# Patient Record
Sex: Female | Born: 1975 | Race: White | Hispanic: No | Marital: Married | Smoking: Never smoker
Health system: Southern US, Community
[De-identification: ages and names within clinical notes are randomized; demographics above are authoritative.]

## PROBLEM LIST (undated history)

## (undated) DIAGNOSIS — S0300XA Dislocation of jaw, unspecified side, initial encounter: Secondary | ICD-10-CM

## (undated) DIAGNOSIS — G43909 Migraine, unspecified, not intractable, without status migrainosus: Secondary | ICD-10-CM

## (undated) DIAGNOSIS — R42 Dizziness and giddiness: Secondary | ICD-10-CM

## (undated) DIAGNOSIS — J189 Pneumonia, unspecified organism: Secondary | ICD-10-CM

## (undated) HISTORY — DX: Dislocation of jaw, unspecified side, initial encounter: S03.00XA

## (undated) HISTORY — PX: BILATERAL SALPINGECTOMY: SHX5743

## (undated) HISTORY — PX: TUBAL LIGATION: SHX77

## (undated) HISTORY — DX: Dizziness and giddiness: R42

## (undated) HISTORY — PX: ANKLE ARTHROSCOPY: SUR85

## (undated) HISTORY — DX: Pneumonia, unspecified organism: J18.9

## (undated) HISTORY — PX: PARTIAL HYSTERECTOMY: SHX80

## (undated) HISTORY — PX: APPENDECTOMY: SHX54

---

## 2016-09-27 ENCOUNTER — Encounter (HOSPITAL_BASED_OUTPATIENT_CLINIC_OR_DEPARTMENT_OTHER): Payer: Self-pay | Admitting: *Deleted

## 2016-09-27 ENCOUNTER — Emergency Department (HOSPITAL_BASED_OUTPATIENT_CLINIC_OR_DEPARTMENT_OTHER)
Admission: EM | Admit: 2016-09-27 | Discharge: 2016-09-27 | Disposition: A | Discharge status: Home / Self Care | Payer: Managed Care, Other (non HMO) | Attending: Emergency Medicine | Admitting: Emergency Medicine

## 2016-09-27 DIAGNOSIS — G43911 Migraine, unspecified, intractable, with status migrainosus: Secondary | ICD-10-CM | POA: Diagnosis not present

## 2016-09-27 DIAGNOSIS — G43909 Migraine, unspecified, not intractable, without status migrainosus: Secondary | ICD-10-CM | POA: Diagnosis present

## 2016-09-27 HISTORY — DX: Migraine, unspecified, not intractable, without status migrainosus: G43.909

## 2016-09-27 MED ORDER — DEXAMETHASONE SODIUM PHOSPHATE 10 MG/ML IJ SOLN
10.0000 mg | Freq: Once | INTRAMUSCULAR | Status: AC
  Administered 2016-09-27: 10 mg via INTRAVENOUS
  Filled 2016-09-27: qty 1

## 2016-09-27 MED ORDER — SODIUM CHLORIDE 0.9 % IV SOLN
INTRAVENOUS | Status: DC
  Administered 2016-09-27: 10:00:00 via INTRAVENOUS

## 2016-09-27 MED ORDER — DIPHENHYDRAMINE HCL 50 MG/ML IJ SOLN
25.0000 mg | Freq: Once | INTRAMUSCULAR | Status: AC
  Administered 2016-09-27: 25 mg via INTRAVENOUS
  Filled 2016-09-27: qty 1

## 2016-09-27 MED ORDER — PROMETHAZINE HCL 25 MG/ML IJ SOLN
12.5000 mg | Freq: Once | INTRAMUSCULAR | Status: AC
  Administered 2016-09-27: 12.5 mg via INTRAVENOUS
  Filled 2016-09-27: qty 1

## 2016-09-27 MED ORDER — SODIUM CHLORIDE 0.9 % IV BOLUS (SEPSIS)
1000.0000 mL | Freq: Once | INTRAVENOUS | Status: AC
  Administered 2016-09-27: 1000 mL via INTRAVENOUS

## 2016-09-27 NOTE — Discharge Instructions (Signed)
Recommend going home and rest in a dark room. Return for any new or worse symptoms. Or if not better by tomorrow. Work note provided.

## 2016-09-27 NOTE — ED Triage Notes (Signed)
Pt reports her usual migraine pain x Monday. Pt is alert, smiling and talking in nad.

## 2016-09-27 NOTE — ED Provider Notes (Signed)
[facility]   CSN: [id number] Arrival date & time: 09/27/16  0848     History   Chief Complaint Chief Complaint  Patient presents with  . Migraine    HPI Patience Rebecca Briggs is a 41 y.o. female.  Patient with history of migraines. Patient states this is very typical for her migraines. Symptoms started on Monday. Headache is bilateral 4 head. Also some pain at the base of the skull. Associated with nausea but no vomiting some dizziness but no vertigo and photophobia. Patient tried Imitrex without any relief.      Past Medical History:  Diagnosis Date  . Migraine     There are no active problems to display for this patient.   History reviewed. No pertinent surgical history.  OB History    No data available       Home Medications    Prior to Admission medications   Not on File    Family History History reviewed. No pertinent family history.  Social History Social History  Substance Use Topics  . Smoking status: Never Smoker  . Smokeless tobacco: Never Used  . Alcohol use Not on file     Allergies   Patient has no known allergies.   Review of Systems Review of Systems  Constitutional: Negative for fever.  HENT: Negative for congestion.   Eyes: Positive for photophobia. Negative for visual disturbance.  Respiratory: Negative for shortness of breath.   Cardiovascular: Negative for chest pain.  Gastrointestinal: Negative for abdominal pain.  Genitourinary: Negative for dysuria.  Musculoskeletal: Positive for neck pain. Negative for back pain.  Neurological: Positive for dizziness and headaches.  Hematological: Does not bruise/bleed easily.  Psychiatric/Behavioral: Negative for confusion.     Physical Exam Updated Vital Signs BP 124/90 (BP Location: Left Arm)   Pulse 95   Temp 98.4 F (36.9 C) (Oral)   Resp 18   Ht 5' 7.5" (1.715 m)   Wt 84.8 kg   LMP 09/11/2016   SpO2 100%   BMI 28.86 kg/m   Physical Exam    Constitutional: She is oriented to person, place, and time. She appears well-developed and well-nourished. No distress.  HENT:  Head: Normocephalic and atraumatic.  Mouth/Throat: Oropharynx is clear and moist.  Eyes: Conjunctivae and EOM are normal. Pupils are equal, round, and reactive to light.  Neck: Normal range of motion. Neck supple.  Cardiovascular: Normal rate and regular rhythm.   Pulmonary/Chest: Effort normal and breath sounds normal.  Abdominal: Soft. Bowel sounds are normal. There is no tenderness.  Musculoskeletal: Normal range of motion.  Neurological: She is alert and oriented to person, place, and time. No cranial nerve deficit or sensory deficit. She exhibits normal muscle tone. Coordination normal.  Skin: Skin is warm.  Nursing note and vitals reviewed.    ED Treatments / Results  Labs (all labs ordered are listed, but only abnormal results are displayed) Labs Reviewed - No data to display  EKG  EKG Interpretation None       Radiology No results found.  Procedures Procedures (including critical care time)  Medications Ordered in ED Medications  0.9 %  sodium chloride infusion ( Intravenous New Bag/Given 09/27/16 0959)  sodium chloride 0.9 % bolus 1,000 mL (1,000 mLs Intravenous New Bag/Given 09/27/16 0955)  promethazine (PHENERGAN) injection 12.5 mg (12.5 mg Intravenous Given 09/27/16 0955)  dexamethasone (DECADRON) injection 10 mg (10 mg Intravenous Given 09/27/16 0956)  diphenhydrAMINE (BENADRYL) injection 25 mg (25 mg Intravenous Given 09/27/16 0956)  Initial Impression / Assessment and Plan / ED Course  I have reviewed the triage vital signs and the nursing notes.  Pertinent labs & imaging results that were available during my care of the patient were reviewed by me and considered in my medical decision making (see chart for details).     Patient nontoxic no acute distress. Symptoms consistent with migraine for her. That started on Monday. Did not  respond to her Imitrex. Patient given migraine cocktail here Phenergan, and a drill, Decadron. Patient stable for discharge home. Patient will rest. Patient with only minimal improvement with medications but not worse. Patient able to rest comfortably.  Final Clinical Impressions(s) / ED Diagnoses   Final diagnoses:  Intractable migraine with status migrainosus, unspecified migraine type    New Prescriptions New Prescriptions   No medications on file     Vanetta MuldersScott Jermel Artley, MD 09/27/16 1116

## 2016-10-05 ENCOUNTER — Encounter (HOSPITAL_BASED_OUTPATIENT_CLINIC_OR_DEPARTMENT_OTHER): Payer: Self-pay

## 2016-10-05 ENCOUNTER — Emergency Department (HOSPITAL_BASED_OUTPATIENT_CLINIC_OR_DEPARTMENT_OTHER)
Admission: EM | Admit: 2016-10-05 | Discharge: 2016-10-05 | Disposition: A | Discharge status: Home / Self Care | Payer: Managed Care, Other (non HMO) | Attending: Emergency Medicine | Admitting: Emergency Medicine

## 2016-10-05 ENCOUNTER — Emergency Department (HOSPITAL_BASED_OUTPATIENT_CLINIC_OR_DEPARTMENT_OTHER): Payer: Managed Care, Other (non HMO)

## 2016-10-05 DIAGNOSIS — G43009 Migraine without aura, not intractable, without status migrainosus: Secondary | ICD-10-CM | POA: Insufficient documentation

## 2016-10-05 DIAGNOSIS — R42 Dizziness and giddiness: Secondary | ICD-10-CM | POA: Diagnosis present

## 2016-10-05 LAB — CBC
HEMATOCRIT: 40.1 % (ref 36.0–46.0)
Hemoglobin: 13.9 g/dL (ref 12.0–15.0)
MCH: 30.8 pg (ref 26.0–34.0)
MCHC: 34.7 g/dL (ref 30.0–36.0)
MCV: 88.9 fL (ref 78.0–100.0)
PLATELETS: 142 10*3/uL — AB (ref 150–400)
RBC: 4.51 MIL/uL (ref 3.87–5.11)
RDW: 11.8 % (ref 11.5–15.5)
WBC: 5.3 10*3/uL (ref 4.0–10.5)

## 2016-10-05 LAB — BASIC METABOLIC PANEL
Anion gap: 6 (ref 5–15)
BUN: 14 mg/dL (ref 6–20)
CHLORIDE: 106 mmol/L (ref 101–111)
CO2: 25 mmol/L (ref 22–32)
CREATININE: 0.66 mg/dL (ref 0.44–1.00)
Calcium: 8.9 mg/dL (ref 8.9–10.3)
GFR calc non Af Amer: >60 mL/min (ref >60)
Glucose, Bld: 98 mg/dL (ref 65–99)
POTASSIUM: 4.1 mmol/L (ref 3.5–5.1)
Sodium: 137 mmol/L (ref 135–145)

## 2016-10-05 MED ORDER — SODIUM CHLORIDE 0.9 % IV BOLUS (SEPSIS)
2000.0000 mL | Freq: Once | INTRAVENOUS | Status: AC
  Administered 2016-10-05: 2000 mL via INTRAVENOUS

## 2016-10-05 MED ORDER — PROCHLORPERAZINE EDISYLATE 5 MG/ML IJ SOLN
10.0000 mg | Freq: Once | INTRAMUSCULAR | Status: AC
  Administered 2016-10-05: 10 mg via INTRAVENOUS
  Filled 2016-10-05: qty 2

## 2016-10-05 MED ORDER — SODIUM CHLORIDE 0.9 % IV BOLUS (SEPSIS): 1000.0000 mL | Freq: Once | INTRAVENOUS | Status: DC

## 2016-10-05 NOTE — ED Triage Notes (Addendum)
C/o dizziness x 2 days along with HA-pt was treated last week for migraine-n/v, blurred vision started last night-NAD/pale-slow gait due to c/o dizziness-pt drove self to ED

## 2016-10-05 NOTE — ED Provider Notes (Signed)
[facility]   CSN: [id number] Arrival date & time: 10/05/16  1143     History   Chief Complaint Chief Complaint  Patient presents with  . Dizziness    HPI Rebecca Briggs is a 41 y.o. female.  HPI  Past Medical History:  Diagnosis Date  . Migraine     There are no active problems to display for this patient. comPlains of throbbing headache or onset approximately one week ago. She was seen here 09/27/2016 for same complaint. Receive "migraine headache cocktail" and felt temporarily improved. She treated self with Imitrex when the headaches first started, without relief. Since discharged from here on 09/27/2016 take his worsened coated by lightheadedness and she states she's fainted 3 or 4 times due to lightheadedness she's vomited 4 times yesterday. She denies any fever. She reports her vision is sometimes "blurred" in that upon looking from side to side she does not feel like "my eyes with me" no other associated symptoms. Headache is throbbing in nature. She gets severe migraine headaches approximately 4 times per year. Associated symptoms include dizziness meaning lightheadedness no sensation of vertigo. Also complains of photophobia. No other associated symptoms bright lights make symptoms worse. Nothing makes symptoms better. She has no hyperacusis.  Past Surgical History:  Procedure Laterality Date  . APPENDECTOMY    . TUBAL LIGATION      OB History    No data available       Home Medications    Prior to Admission medications   Not on File    Family History No family history on file.  Social History Social History  Substance Use Topics  . Smoking status: Never Smoker  . Smokeless tobacco: Never Used  . Alcohol use Yes     Comment: occ    Denies drug use Allergies   Patient has no known allergies.   Review of Systems Review of Systems  Constitutional: Negative.   HENT: Negative.   Eyes: Positive for photophobia and visual  disturbance.  Respiratory: Negative.   Cardiovascular: Negative.   Gastrointestinal: Positive for nausea and vomiting.  Musculoskeletal: Negative.   Skin: Negative.   Neurological: Positive for light-headedness.  Psychiatric/Behavioral: Negative.   All other systems reviewed and are negative.    Physical Exam Updated Vital Signs BP 155/76 (BP Location: Left Arm)   Pulse 78   Temp 98.2 F (36.8 C) (Oral)   Resp 16   Ht 5\' 7"  (1.702 m)   Wt 191 lb (86.6 kg)   LMP 09/11/2016   SpO2 100%   BMI 29.91 kg/m   Physical Exam  Constitutional: She is oriented to person, place, and time. She appears well-developed and well-nourished. No distress.  HENT:  Head: Normocephalic and atraumatic.  Right Ear: External ear normal.  Left Ear: External ear normal.  Mouth/Throat: Oropharynx is clear and moist.  BiLateral tympanic membranes normal  Eyes: Conjunctivae are normal. Pupils are equal, round, and reactive to light.  Fundi benign  Neck: Neck supple. No tracheal deviation present. No thyromegaly present.  Cardiovascular: Normal rate and regular rhythm.   No murmur heard. Pulmonary/Chest: Effort normal and breath sounds normal.  Abdominal: Soft. Bowel sounds are normal. She exhibits no distension. There is no tenderness.  Musculoskeletal: Normal range of motion. She exhibits no edema or tenderness.  Neurological: She is alert and oriented to person, place, and time. No cranial nerve deficit. Coordination normal.  Cranial nerves II through XII grossly intact. Gait normal Romberg normal pronator  drift normal finger to nose normal DTR symmetric bilaterally at knee jerk ankle jerk and biceps toes downward going bilaterally  Skin: Skin is warm and dry. No rash noted.  Psychiatric: She has a normal mood and affect.  Nursing note and vitals reviewed.    ED Treatments / Results  Labs (all labs ordered are listed, but only abnormal results are displayed) Labs Reviewed  BASIC METABOLIC  PANEL  CBC    EKG  EKG Interpretation  Date/Time:  Friday October 05 2016 12:04:55 EST Ventricular Rate:  87 PR Interval:  128 QRS Duration: 68 QT Interval:  360 QTC Calculation: 433 R Axis:   40 Text Interpretation:  Normal sinus rhythm with sinus arrhythmia Low voltage QRS Septal infarct , age undetermined Abnormal ECG No old tracing to compare Confirmed by Ethelda Chick  MD, Sereen Schaff [phone]) on 10/05/2016 12:22:23 PM      Results for orders placed or performed during the hospital encounter of 10/05/16  Basic metabolic panel  Result Value Ref Range   Sodium 137 135 - 145 mmol/L   Potassium 4.1 3.5 - 5.1 mmol/L   Chloride 106 101 - 111 mmol/L   CO2 25 22 - 32 mmol/L   Glucose, Bld 98 65 - 99 mg/dL   BUN 14 6 - 20 mg/dL   Creatinine, Ser 6.04 0.44 - 1.00 mg/dL   Calcium 8.9 8.9 - 54.0 mg/dL   GFR calc non Af Amer >60 >60 mL/min   GFR calc Af Amer >60 >60 mL/min   Anion gap 6 5 - 15  CBC  Result Value Ref Range   WBC 5.3 4.0 - 10.5 K/uL   RBC 4.51 3.87 - 5.11 MIL/uL   Hemoglobin 13.9 12.0 - 15.0 g/dL   HCT 98.1 19.1 - 47.8 %   MCV 88.9 78.0 - 100.0 fL   MCH 30.8 26.0 - 34.0 pg   MCHC 34.7 30.0 - 36.0 g/dL   RDW 29.5 62.1 - 30.8 %   Platelets 142 (L) 150 - 400 K/uL   Ct Head Wo Contrast  Result Date: 10/05/2016 CLINICAL DATA:  Fainted this morning, headache, syncope EXAM: CT HEAD WITHOUT CONTRAST TECHNIQUE: Contiguous axial images were obtained from the base of the skull through the vertex without intravenous contrast. COMPARISON:  None. FINDINGS: Brain: No evidence of acute infarction, hemorrhage, hydrocephalus, extra-axial collection or mass lesion/mass effect. Vascular: No hyperdense vessel or unexpected calcification. Skull: No osseous abnormality. Sinuses/Orbits: Visualized paranasal sinuses are clear. Visualized mastoid sinuses are clear. Visualized orbits demonstrate no focal abnormality. Other: None IMPRESSION: No acute intracranial pathology. Electronically Signed   By:  Elige Ko   On: 10/05/2016 14:06    Radiology Ct Head Wo Contrast  Result Date: 10/05/2016 CLINICAL DATA:  Fainted this morning, headache, syncope EXAM: CT HEAD WITHOUT CONTRAST TECHNIQUE: Contiguous axial images were obtained from the base of the skull through the vertex without intravenous contrast. COMPARISON:  None. FINDINGS: Brain: No evidence of acute infarction, hemorrhage, hydrocephalus, extra-axial collection or mass lesion/mass effect. Vascular: No hyperdense vessel or unexpected calcification. Skull: No osseous abnormality. Sinuses/Orbits: Visualized paranasal sinuses are clear. Visualized mastoid sinuses are clear. Visualized orbits demonstrate no focal abnormality. Other: None IMPRESSION: No acute intracranial pathology. Electronically Signed   By: Elige Ko   On: 10/05/2016 14:06    Procedures Procedures (including critical care time)  Medications Ordered in ED Medications  sodium chloride 0.9 % bolus 1,000 mL (not administered)  prochlorperazine (COMPAZINE) injection 10 mg (10 mg Intravenous Given 10/05/16  1407)  sodium chloride 0.9 % bolus 2,000 mL (2,000 mLs Intravenous New Bag/Given 10/05/16 1407)     Initial Impression / Assessment and Plan / ED Course  I have reviewed the triage vital signs and the nursing notes.  Pertinent labs & imaging results that were available during my care of the patient were reviewed by me and considered in my medical decision making (see chart for details).     3:10 PM patient feels much improved after treatment with intravenous Compazine and intravenous fluids. Dizziness much improved. Vision now normal. She is able to drink without difficulty. Syncope felt secondary to dehydration Encourage oral hydration. Referral to primary care. Final Clinical Impressions(s) / ED Diagnoses  Diagnosis #251migraine headache #2 de hydration #3 syncope Final diagnoses:  None    New Prescriptions New Prescriptions   No medications on file       Doug SouSam Amen Dargis, MD 10/05/16 1553

## 2016-10-05 NOTE — Discharge Instructions (Signed)
Make sure that you drink at least six 8 ounce glasses of water or Gatorade each day in order to stay well-hydrated. All the number on your chart to get a primary care physician. Return if concerned for any reason

## 2016-11-02 ENCOUNTER — Emergency Department (HOSPITAL_BASED_OUTPATIENT_CLINIC_OR_DEPARTMENT_OTHER)
Admission: EM | Admit: 2016-11-02 | Discharge: 2016-11-02 | Disposition: A | Discharge status: Home / Self Care | Payer: Managed Care, Other (non HMO) | Attending: Emergency Medicine | Admitting: Emergency Medicine

## 2016-11-02 ENCOUNTER — Emergency Department (HOSPITAL_BASED_OUTPATIENT_CLINIC_OR_DEPARTMENT_OTHER): Payer: Managed Care, Other (non HMO)

## 2016-11-02 ENCOUNTER — Encounter (HOSPITAL_BASED_OUTPATIENT_CLINIC_OR_DEPARTMENT_OTHER): Payer: Self-pay | Admitting: Emergency Medicine

## 2016-11-02 DIAGNOSIS — R05 Cough: Secondary | ICD-10-CM | POA: Diagnosis present

## 2016-11-02 DIAGNOSIS — J069 Acute upper respiratory infection, unspecified: Secondary | ICD-10-CM | POA: Diagnosis not present

## 2016-11-02 DIAGNOSIS — J209 Acute bronchitis, unspecified: Secondary | ICD-10-CM

## 2016-11-02 DIAGNOSIS — B9789 Other viral agents as the cause of diseases classified elsewhere: Secondary | ICD-10-CM

## 2016-11-02 MED ORDER — ALBUTEROL SULFATE HFA 108 (90 BASE) MCG/ACT IN AERS
2.0000 | INHALATION_SPRAY | Freq: Once | RESPIRATORY_TRACT | Status: AC
  Administered 2016-11-02: 2 via RESPIRATORY_TRACT
  Filled 2016-11-02: qty 6.7

## 2016-11-02 MED ORDER — AEROCHAMBER PLUS FLO-VU MEDIUM MISC
1.0000 | Freq: Once | Status: AC
  Administered 2016-11-02: 1
  Filled 2016-11-02: qty 1

## 2016-11-02 NOTE — ED Triage Notes (Signed)
Patient states that she has had a cough x 2 -3 days.

## 2016-11-02 NOTE — ED Provider Notes (Signed)
[facility]   CSN: [id number] Arrival date & time: 11/02/16  1939  By signing my name below, I, Cynda Acres, attest that this documentation has been prepared under the direction and in the presence of Lyndal Pulley, MD. Electronically Signed: Cynda Acres, Scribe. 11/02/16. 10:29 PM.  History   Chief Complaint Chief Complaint  Patient presents with  . Cough   HPI Comments: Rebecca Briggs is a 41 y.o. female with no pertinent medical history, ho presents to the Emergency Department complaining of a persistent cough that began 3 days ago. Patient states she hears a "rumble" in her chest that began yesterday. Patient reports an associated wheeze. No modifying factors indicated. Patient denies any nausea, vomiting, sore throat, fever, shortness of breath, or chest tightness.   The history is provided by the patient. No language interpreter was used.    Past Medical History:  Diagnosis Date  . Migraine     There are no active problems to display for this patient.   Past Surgical History:  Procedure Laterality Date  . APPENDECTOMY    . TUBAL LIGATION      OB History    No data available       Home Medications    Prior to Admission medications   Medication Sig Start Date End Date Taking? Authorizing Provider  meclizine (ANTIVERT) 25 MG tablet Take 25 mg by mouth 3 (three) times daily as needed for dizziness.   Yes Historical Provider, MD  SUMAtriptan (IMITREX) 100 MG tablet Take 100 mg by mouth every 2 (two) hours as needed for migraine. May repeat in 2 hours if headache persists or recurs.   Yes Historical Provider, MD    Family History History reviewed. No pertinent family history.  Social History Social History  Substance Use Topics  . Smoking status: Never Smoker  . Smokeless tobacco: Never Used  . Alcohol use Yes     Comment: occ     Allergies   Patient has no known allergies.   Review of Systems Review of Systems    Constitutional: Negative for fever.  HENT: Negative for sore throat.   Respiratory: Positive for cough and wheezing. Negative for chest tightness and shortness of breath.   Gastrointestinal: Negative for nausea and vomiting.  All other systems reviewed and are negative.    Physical Exam Updated Vital Signs BP (!) 138/105 (BP Location: Right Arm)   Pulse 93   Temp 98.4 F (36.9 C) (Oral)   Resp 20   Ht  (1.702 m)   Wt 186 lb (84.4 kg)   LMP 10/05/2016   SpO2 99%   BMI 29.13 kg/m   Physical Exam  Constitutional: She is oriented to person, place, and time. She appears well-developed and well-nourished. No distress.  HENT:  Head: Normocephalic.  Nose: Nose normal.  Eyes: Conjunctivae are normal.  Neck: Neck supple. No tracheal deviation present.  Cardiovascular: Normal rate and regular rhythm.  Exam reveals no gallop and no friction rub.   No murmur heard. Pulmonary/Chest: Effort normal. No respiratory distress. She has wheezes. She has no rales. She exhibits no tenderness.  Coarse bilateral expiratory wheeze.   Abdominal: Soft. She exhibits no distension.  Neurological: She is alert and oriented to person, place, and time.  Skin: Skin is warm and dry.  Psychiatric: She has a normal mood and affect.  Nursing note and vitals reviewed.    ED Treatments / Results  DIAGNOSTIC STUDIES: Oxygen Saturation is 99% on RA,  normal by my interpretation.    COORDINATION OF CARE: 10:29 PM Discussed treatment plan with pt at bedside and pt agreed to plan, which includes an inhaler.   Labs (all labs ordered are listed, but only abnormal results are displayed) Labs Reviewed - No data to display  EKG  EKG Interpretation None       Radiology Dg Chest 2 View  Result Date: 11/02/2016 CLINICAL DATA:  Chest cold for several days EXAM: CHEST  2 VIEW COMPARISON:  None. FINDINGS: Normal mediastinum and cardiac silhouette. Normal pulmonary vasculature. No evidence of effusion,  infiltrate, or pneumothorax. No acute bony abnormality. IMPRESSION: No acute cardiopulmonary process. Electronically Signed   By: Genevive Bi M.D.   On: 11/02/2016 20:43    Procedures Procedures (including critical care time)  Medications Ordered in ED Medications - No data to display   Initial Impression / Assessment and Plan / ED Course  I have reviewed the triage vital signs and the nursing notes.  Pertinent labs & imaging results that were available during my care of the patient were reviewed by me and considered in my medical decision making (see chart for details).     41 y.o. female presents with cough over the last 3-4 days. Coarse low pitched wheeze noted suggesting bronchitis and excess mucous production. No PNA on CXR. Provided albuterol inhaler and recommended mucinex DM for cough suppression and expectorant.   Final Clinical Impressions(s) / ED Diagnoses   Final diagnoses:  Viral URI with cough  Acute bronchitis, unspecified organism    New Prescriptions Discharge Medication List as of 11/02/2016 10:43 PM     I personally performed the services described in this documentation, which was scribed in my presence. The recorded information has been reviewed and is accurate.      Lyndal Pulley, MD 11/03/16 [phone]

## 2016-11-02 NOTE — ED Notes (Signed)
Pt updated on delay, comfort measures provided, beverage provided to pt

## 2016-11-02 NOTE — ED Notes (Signed)
Pt states this is day 3, states has been having a prod cough, unable to sleep, having low grade fevers.

## 2016-11-02 NOTE — ED Notes (Signed)
Pt provided 2 puffs of MDI albuterol via Aerochamber, pt returned demonstration very well, opportunity for questions provided.

## 2016-11-02 NOTE — ED Notes (Signed)
2 view CXR done.

## 2016-11-02 NOTE — ED Notes (Signed)
ED Provider at bedside. 

## 2017-06-06 IMAGING — CR DG CHEST 2V
2 series · 2 of 2 positions shown · non-contrast
Comparison: None.

CLINICAL DATA: Chest cold for several days

EXAM:
CHEST  2 VIEW

[w chest pa]
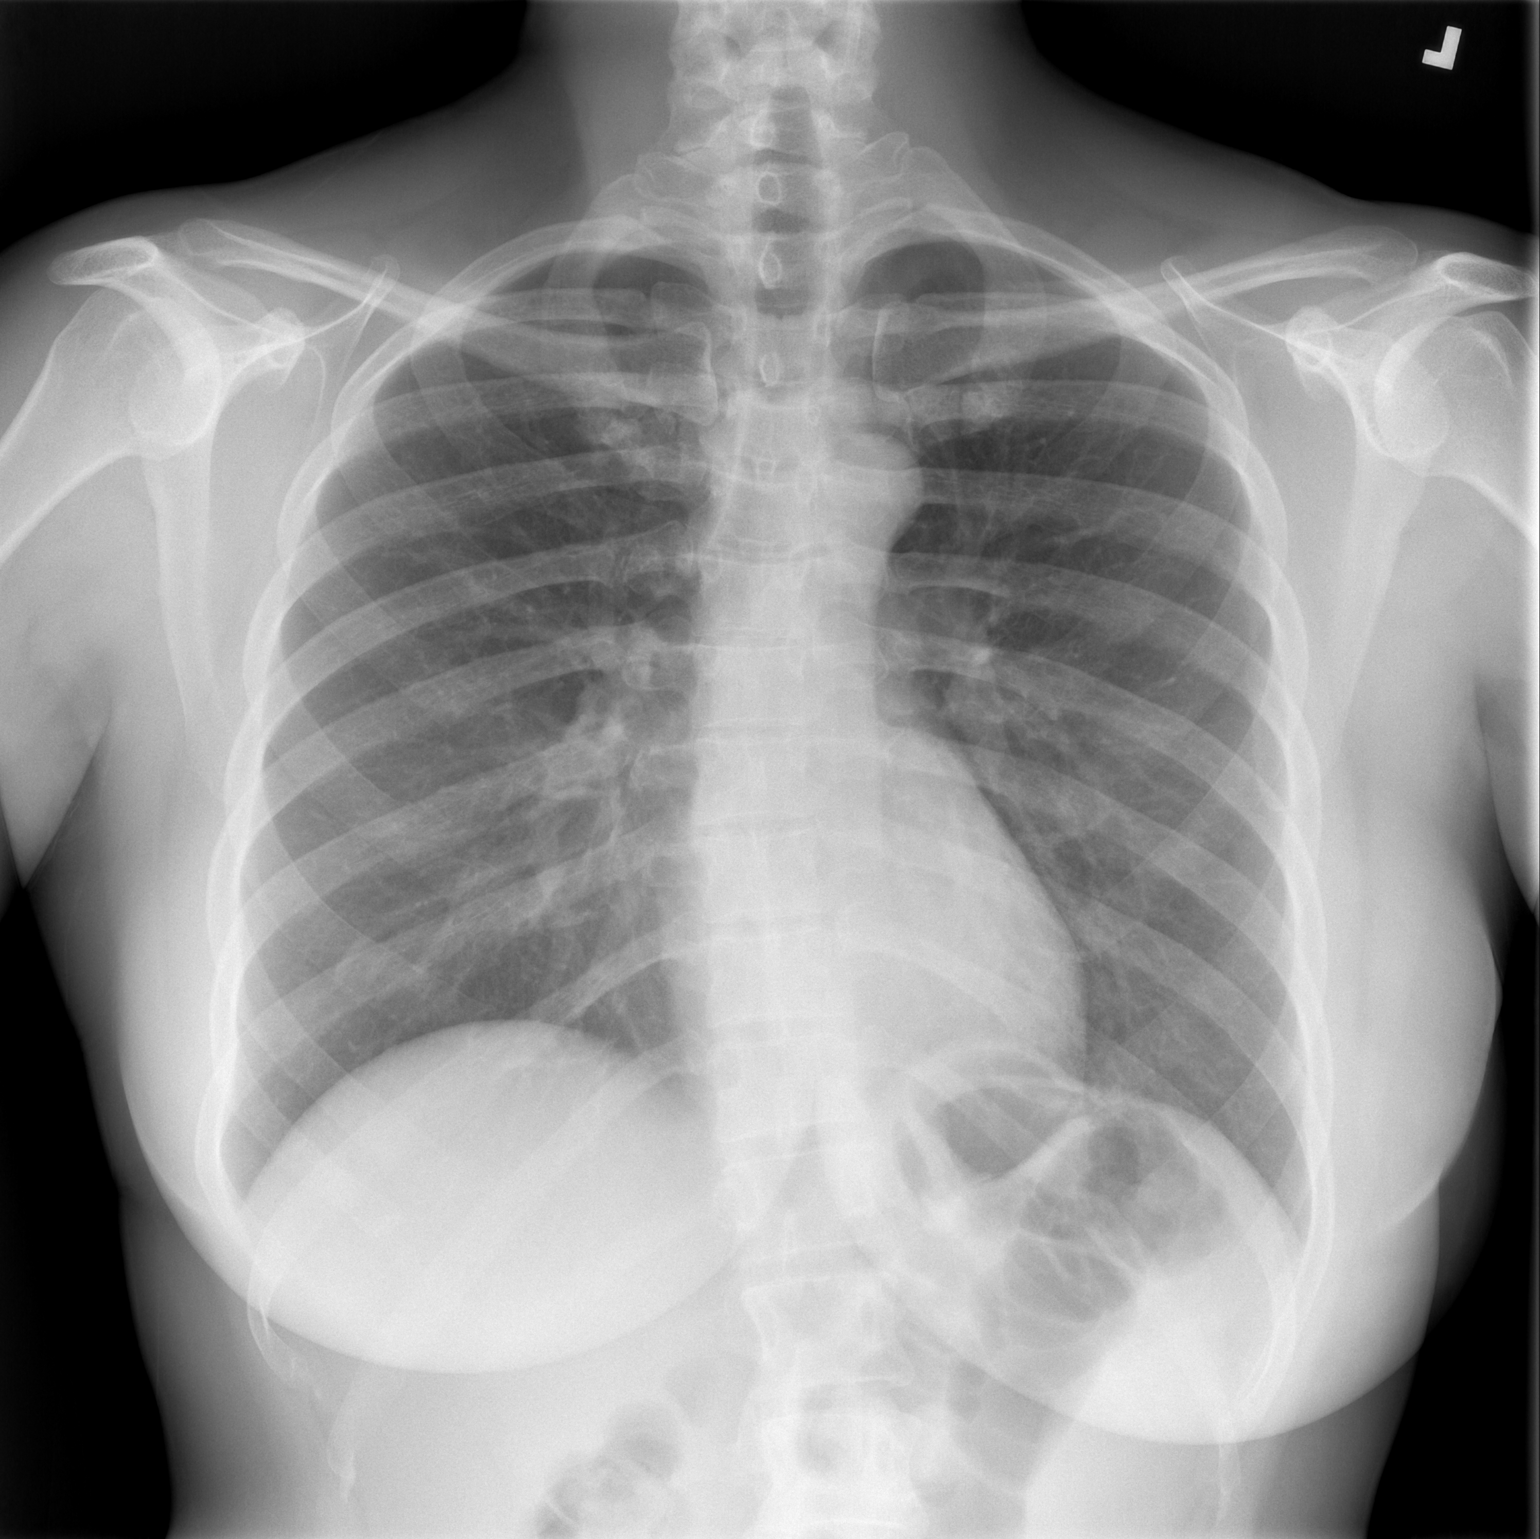

[w chest lat]
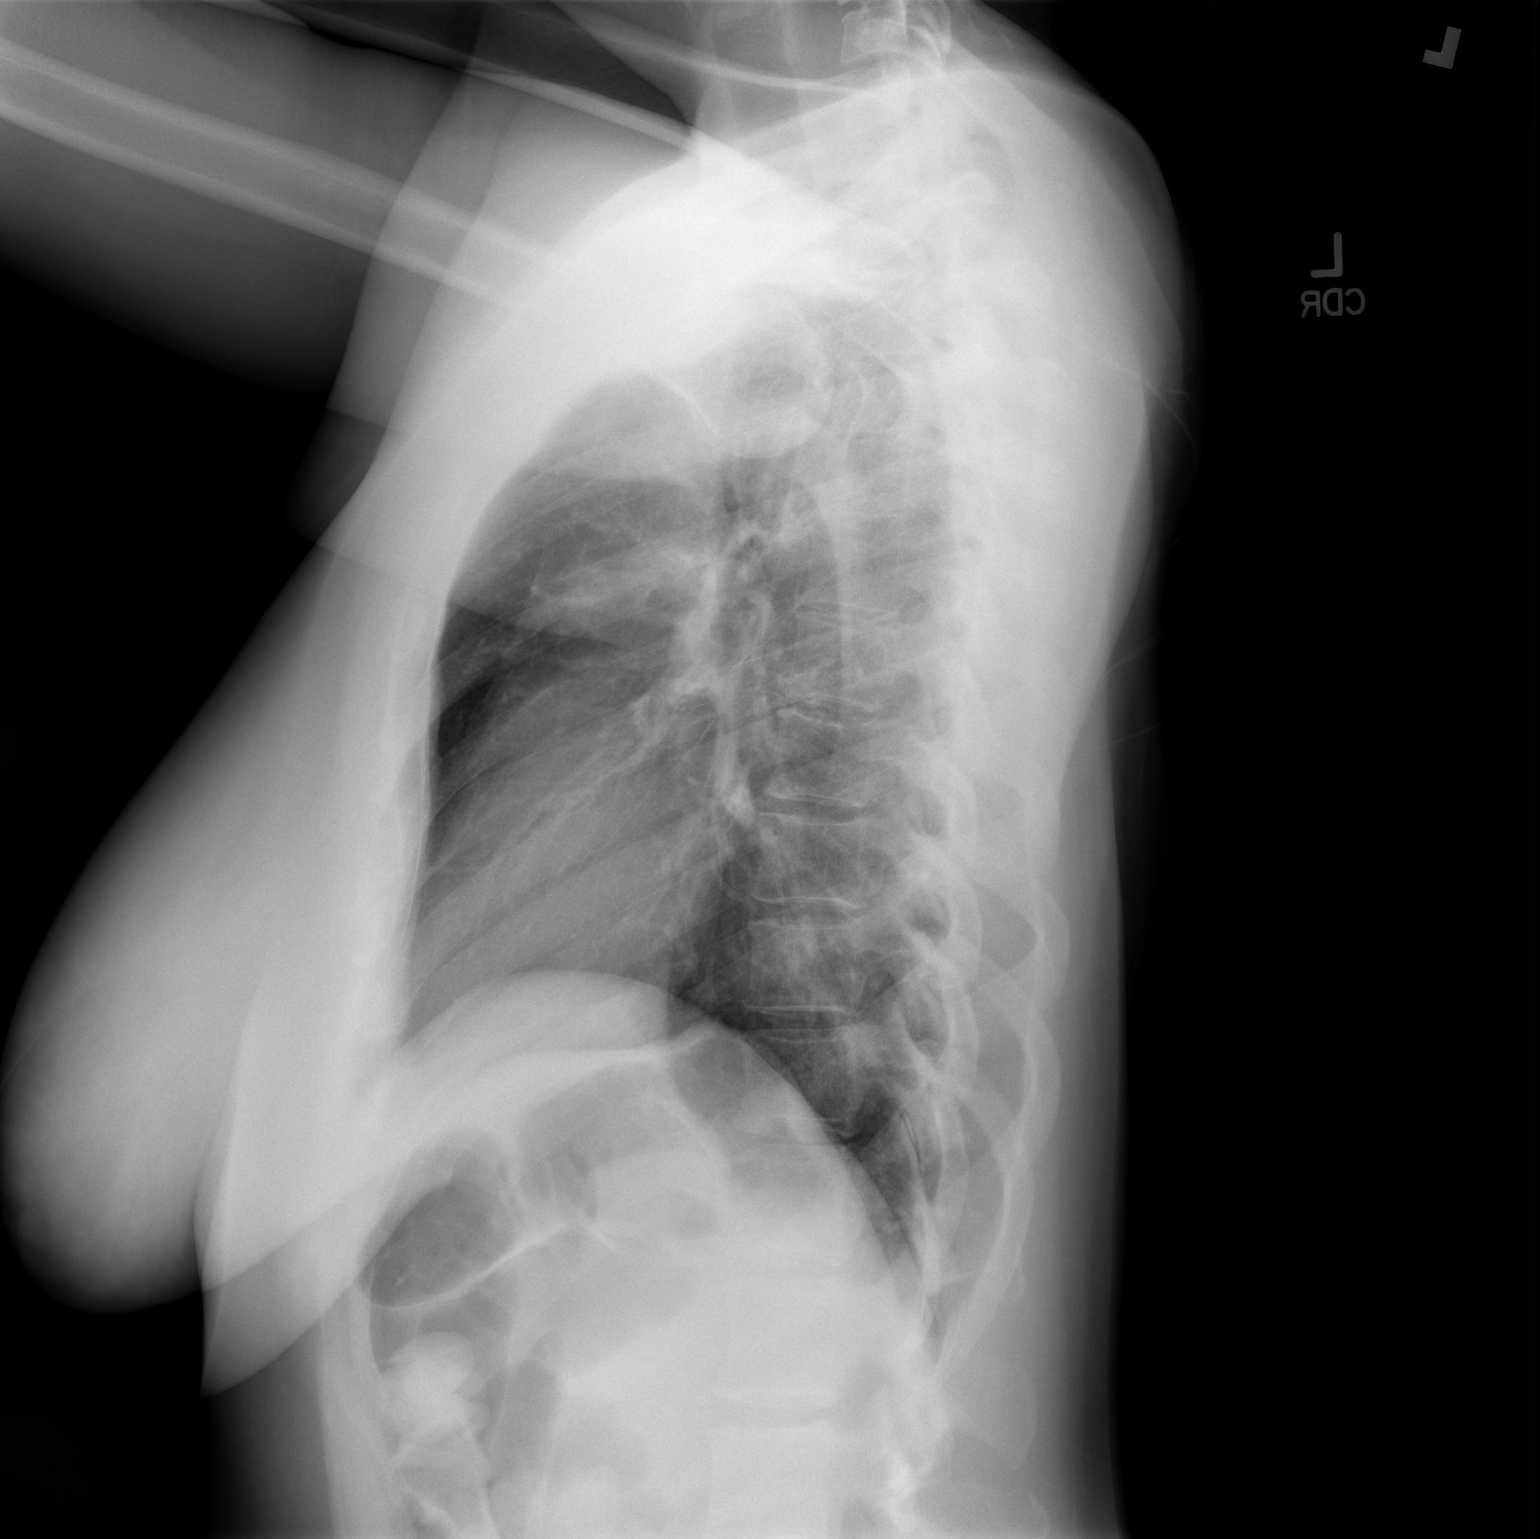

[2 of 2 positions shown; findings below may reference images not displayed]

FINDINGS: Normal mediastinum and cardiac silhouette. Normal pulmonary
vasculature. No evidence of effusion, infiltrate, or pneumothorax.
No acute bony abnormality.
IMPRESSION: No acute cardiopulmonary process.

## 2017-09-19 ENCOUNTER — Encounter: Payer: Self-pay | Admitting: Internal Medicine

## 2017-09-19 ENCOUNTER — Other Ambulatory Visit: Payer: Self-pay

## 2017-09-19 ENCOUNTER — Encounter (HOSPITAL_BASED_OUTPATIENT_CLINIC_OR_DEPARTMENT_OTHER): Payer: Self-pay | Admitting: Emergency Medicine

## 2017-09-19 ENCOUNTER — Emergency Department (HOSPITAL_BASED_OUTPATIENT_CLINIC_OR_DEPARTMENT_OTHER)
Admission: EM | Admit: 2017-09-19 | Discharge: 2017-09-19 | Disposition: A | Discharge status: Home / Self Care | Payer: 59 | Attending: Emergency Medicine | Admitting: Emergency Medicine

## 2017-09-19 ENCOUNTER — Emergency Department (HOSPITAL_BASED_OUTPATIENT_CLINIC_OR_DEPARTMENT_OTHER): Payer: 59

## 2017-09-19 DIAGNOSIS — I10 Essential (primary) hypertension: Secondary | ICD-10-CM | POA: Insufficient documentation

## 2017-09-19 DIAGNOSIS — Z79899 Other long term (current) drug therapy: Secondary | ICD-10-CM | POA: Diagnosis not present

## 2017-09-19 DIAGNOSIS — E119 Type 2 diabetes mellitus without complications: Secondary | ICD-10-CM | POA: Insufficient documentation

## 2017-09-19 DIAGNOSIS — R079 Chest pain, unspecified: Secondary | ICD-10-CM | POA: Insufficient documentation

## 2017-09-19 DIAGNOSIS — K229 Disease of esophagus, unspecified: Secondary | ICD-10-CM | POA: Insufficient documentation

## 2017-09-19 LAB — COMPREHENSIVE METABOLIC PANEL
ALT: 16 U/L (ref 14–54)
AST: 21 U/L (ref 15–41)
Albumin: 3.9 g/dL (ref 3.5–5.0)
Alkaline Phosphatase: 48 U/L (ref 38–126)
Anion gap: 7 (ref 5–15)
BUN: 13 mg/dL (ref 6–20)
CHLORIDE: 106 mmol/L (ref 101–111)
CO2: 26 mmol/L (ref 22–32)
CREATININE: 0.84 mg/dL (ref 0.44–1.00)
Calcium: 9.3 mg/dL (ref 8.9–10.3)
GFR calc Af Amer: >60 mL/min (ref >60)
Glucose, Bld: 93 mg/dL (ref 65–99)
Potassium: 4.7 mmol/L (ref 3.5–5.1)
Sodium: 139 mmol/L (ref 135–145)
Total Bilirubin: 0.6 mg/dL (ref 0.3–1.2)
Total Protein: 6.9 g/dL (ref 6.5–8.1)

## 2017-09-19 LAB — CBC WITH DIFFERENTIAL/PLATELET
BASOS ABS: 0 10*3/uL (ref 0.0–0.1)
Basophils Relative: 1 %
EOS PCT: 2 %
Eosinophils Absolute: 0.1 10*3/uL (ref 0.0–0.7)
HEMATOCRIT: 40.4 % (ref 36.0–46.0)
HEMOGLOBIN: 13.8 g/dL (ref 12.0–15.0)
LYMPHS PCT: 41 %
Lymphs Abs: 1.5 10*3/uL (ref 0.7–4.0)
MCH: 30.8 pg (ref 26.0–34.0)
MCHC: 34.2 g/dL (ref 30.0–36.0)
MCV: 90.2 fL (ref 78.0–100.0)
Monocytes Absolute: 0.3 10*3/uL (ref 0.1–1.0)
Monocytes Relative: 9 %
NEUTROS ABS: 1.6 10*3/uL — AB (ref 1.7–7.7)
NEUTROS PCT: 47 %
PLATELETS: 150 10*3/uL (ref 150–400)
RBC: 4.48 MIL/uL (ref 3.87–5.11)
RDW: 12.2 % (ref 11.5–15.5)
WBC: 3.5 10*3/uL — AB (ref 4.0–10.5)

## 2017-09-19 LAB — TROPONIN I

## 2017-09-19 MED ORDER — PANTOPRAZOLE SODIUM 40 MG PO TBEC: 40.0000 mg | DELAYED_RELEASE_TABLET | Freq: Every day | ORAL | 0 refills | Status: DC

## 2017-09-19 MED ORDER — GI COCKTAIL ~~LOC~~
30.0000 mL | Freq: Once | ORAL | Status: AC
  Administered 2017-09-19: 30 mL via ORAL
  Filled 2017-09-19: qty 30

## 2017-09-19 NOTE — ED Triage Notes (Signed)
Pt states she was eating breakfast on Monday when she had sudden onset of chest pressure as if food was stuck.  Pt continues to have pain and worsening with eating.  Today she feels like she is having someone squeezing her in her chest.

## 2017-09-19 NOTE — ED Provider Notes (Signed)
[facility] EMERGENCY DEPARTMENT Provider Note   CSN: [id number] Arrival date & time: 09/19/17  1324     History   Chief Complaint Chief Complaint  Patient presents with  . Chest Pain    after eating breakfast    HPI Rebecca Briggs is a 42 y.o. female.  HPI   On Monday had a migraine, was trying to eat eggs and feels like it got stuck and sudden pain, felt like a squeezing pain that went to the back, and has had intermittent pain since then.  Pain has been brought on by eating and drinking, with symptoms worsened when trying to swallow and is 9/10 and feels like things get stuck, but is able to swallow food and liquids, no drooling.  Today/this morning, was not eating, but was having waxing and waning squeezing pain which has been constant since 530AM today.  Still feeling pain radiating to back. No radiation to arms or neck.  No fevers or cough.  Early menses today.  No numbness or weakness.  Had nausea and vomiting prior with migraine, still having some headache now but n/v. No diaphoresis.  Lightheadedness/dizziness with migraine, still feeling some of that now.   No hx of htn, hlpd, DM No smoking or other drugs, occasional etoh Father htn. Grandma afib.  No fam hx of known early CAD  Past Medical History:  Diagnosis Date  . Migraine     There are no active problems to display for this patient.   Past Surgical History:  Procedure Laterality Date  . APPENDECTOMY    . TUBAL LIGATION      OB History    No data available       Home Medications    Prior to Admission medications   Medication Sig Start Date End Date Taking? Authorizing Provider  ondansetron (ZOFRAN) 4 MG tablet Take 4 mg by mouth every 8 (eight) hours as needed for nausea or vomiting.   Yes [provider]  meclizine (ANTIVERT) 25 MG tablet Take 25 mg by mouth 3 (three) times daily as needed for dizziness.    [provider]  pantoprazole (PROTONIX) 40 MG tablet Take 1 tablet  (40 mg total) by mouth daily. 09/19/17 10/19/17  Alvira Monday, MD  SUMAtriptan (IMITREX) 100 MG tablet Take 100 mg by mouth every 2 (two) hours as needed for migraine. May repeat in 2 hours if headache persists or recurs.    [provider]    Family History No family history on file.  Social History Social History   Tobacco Use  . Smoking status: Never Smoker  . Smokeless tobacco: Never Used  Substance Use Topics  . Alcohol use: Yes    Comment: occ  . Drug use: No     Allergies   Patient has no known allergies.   Review of Systems Review of Systems  Constitutional: Negative for fever.  HENT: Negative for sore throat.   Eyes: Negative for visual disturbance.  Respiratory: Negative for cough and shortness of breath.   Cardiovascular: Positive for chest pain. Negative for leg swelling.  Gastrointestinal: Negative for abdominal pain, nausea and vomiting (now resolved, had previously with migraine).  Genitourinary: Negative for difficulty urinating.  Musculoskeletal: Negative for back pain and neck pain.  Skin: Negative for rash.  Neurological: Positive for dizziness, light-headedness (attributes to migraine) and headaches. Negative for syncope.     Physical Exam Updated Vital Signs BP 114/80   Pulse 69   Temp 98.4 F (36.9  C) (Oral)   Resp 18   Ht 5\' 7"  (1.702 m)   Wt 89.4 kg (197 lb)   LMP 09/16/2017   SpO2 100%   BMI 30.85 kg/m   Physical Exam  Constitutional: She is oriented to person, place, and time. She appears well-developed and well-nourished. No distress.  HENT:  Head: Normocephalic and atraumatic.  Eyes: Conjunctivae and EOM are normal.  Neck: Normal range of motion.  Cardiovascular: Normal rate, regular rhythm, normal heart sounds and intact distal pulses. Exam reveals no gallop and no friction rub.  No murmur heard. Pulmonary/Chest: Effort normal and breath sounds normal. No respiratory distress. She has no wheezes. She has no rales.    Abdominal: Soft. She exhibits no distension. There is no tenderness. There is no guarding.  Musculoskeletal: She exhibits no edema or tenderness.  Neurological: She is alert and oriented to person, place, and time.  Skin: Skin is warm and dry. No rash noted. She is not diaphoretic. No erythema.  Nursing note and vitals reviewed.    ED Treatments / Results  Labs (all labs ordered are listed, but only abnormal results are displayed) Labs Reviewed  CBC WITH DIFFERENTIAL/PLATELET - Abnormal; Notable for the following components:      Result Value   WBC 3.5 (*)    Neutro Abs 1.6 (*)    All other components within normal limits  COMPREHENSIVE METABOLIC PANEL  TROPONIN I    EKG  EKG Interpretation  Date/Time:  Thursday September 19 2017 10:37:50 EST Ventricular Rate:  74 PR Interval:    QRS Duration: 80 QT Interval:  392 QTC Calculation: 435 R Axis:   88 Text Interpretation:  Sinus rhythm Low voltage, precordial leads No significant change since last tracing Confirmed by Alvira Monday ([id number]) on 09/19/2017 11:10:54 AM       Radiology Dg Chest 2 View  Result Date: 09/19/2017 CLINICAL DATA:  Mid chest pain radiating to the back beginning 3 days ago. EXAM: CHEST  2 VIEW COMPARISON:  11/02/2016 FINDINGS: The cardiomediastinal silhouette is within normal limits. The lungs are well inflated and clear. There is no evidence of pleural effusion or pneumothorax. No acute osseous abnormality is identified. IMPRESSION: No active cardiopulmonary disease. Electronically Signed   By: Sebastian Ache M.D.   On: 09/19/2017 10:33    Procedures Procedures (including critical care time)  Medications Ordered in ED Medications  gi cocktail (Maalox,Lidocaine,Donnatal) (30 mLs Oral Given 09/19/17 1030)     Initial Impression / Assessment and Plan / ED Course  I have reviewed the triage vital signs and the nursing notes.  Pertinent labs & imaging results that were available during my care of the  patient were reviewed by me and considered in my medical decision making (see chart for details).     42 year old female with no significant medical history presents with concern for chest pain present with eating.  EKG without significant findings.  Chest x-ray shows no sign of pneumothorax, pneumonia, or mediastinal abnormality.  Given normal vital signs, normal x-ray Boorhaave's  Normal pulses bilaterally, given pain associated with swallowing suspect esophageal abnormality and doubt dissection in setting of no known risk factors, normal XR, normal pulses.  Troponin WNL, low risk HEAR score. Suspect most likely symptoms secondary to esophageal pathology such as achalasia,  stricture or esophagitis.  She is able to tolerate swallowing food, liquids, and secretions, and doubt food impaction. Discussed with and will refer to Dr. Christella Hartigan of GI for further evaluation. Told patient to stick  with soft diet, take PPI. Patient discharged in stable condition with understanding of reasons to return.    Final Clinical Impressions(s) / ED Diagnoses   Final diagnoses:  Chest pain, unspecified type  Esophageal abnormality    ED Discharge Orders        Ordered    pantoprazole (PROTONIX) 40 MG tablet  Daily     09/19/17 1140       Alvira MondaySchlossman, Julieanna Geraci, MD 09/19/17 1758

## 2017-09-27 ENCOUNTER — Encounter: Payer: Self-pay | Admitting: Internal Medicine

## 2017-09-27 ENCOUNTER — Ambulatory Visit (INDEPENDENT_AMBULATORY_CARE_PROVIDER_SITE_OTHER): Payer: 59 | Admitting: Internal Medicine

## 2017-09-27 VITALS — BP 108/64 | HR 98 | Ht 67.5 in | Wt 199.0 lb

## 2017-09-27 DIAGNOSIS — R1310 Dysphagia, unspecified: Secondary | ICD-10-CM | POA: Diagnosis not present

## 2017-09-27 DIAGNOSIS — R1319 Other dysphagia: Secondary | ICD-10-CM

## 2017-09-27 DIAGNOSIS — R079 Chest pain, unspecified: Secondary | ICD-10-CM | POA: Diagnosis not present

## 2017-09-27 NOTE — Progress Notes (Signed)
Rebecca Briggs 41 y.o. 1976-04-23 [id number]  Assessment & Plan:   Encounter Diagnoses  Name Primary?  . Esophageal dysphagia Yes  . Chest pain, unspecified type     She had an acute self-limited episode of the symptoms.  Cause not clear.  I think it is worthy of an upper endoscopy to evaluate for structural abnormality.  Possible esophageal dilation.  Assuming that is okay, I would probably stop the PPI and observe.  The risks and benefits as well as alternatives of endoscopic procedure(s) have been discussed and reviewed. All questions answered. The patient agrees to proceed.  [facility]: [facility]   Subjective:   Chief Complaint: Chest pain  HPI The patient is a very nice divorced 42 year old white woman who was seen in the emergency department on January 31, she had had a migraine headache the day before and was trying to eat eggs and it felt like food stuck in her throat and esophagus and she has had significant chest pain etc. until she presented to the emergency department.  She was having odynophagia and some dysphagia.  The symptoms persisted.  A chest x-ray showed no cardiopulmonary disease.  It was essentially negative laboratory testing included a CBC a metabolic panel and a troponin I other than mild leukopenia at 3.5 they were all normal.  An EKG had some low voltage in the precordial leads but was otherwise normal.  She was placed on pantoprazole 40 mg daily and has not had another problem.  She was not taking any antibiotics or other medications that could have precipitated pill esophagitis.  She has never had an episode like that before.  She does not have an antecedent history of heartburn etc. excessive caffeine use at etc.  She does not smoke.  GI review of systems is otherwise negative except for some intermittent bloating and borborygmi symptoms.  Those are much better than they were last year, she associated that with increased stress she said last year was very  stressful. No Known Allergies Current Meds  Medication Sig  . meclizine (ANTIVERT) 25 MG tablet Take 25 mg by mouth 3 (three) times daily as needed for dizziness.  . ondansetron (ZOFRAN) 4 MG tablet Take 4 mg by mouth every 8 (eight) hours as needed for nausea or vomiting.  . pantoprazole (PROTONIX) 40 MG tablet Take 1 tablet (40 mg total) by mouth daily.  . SUMAtriptan (IMITREX) 100 MG tablet Take 100 mg by mouth every 2 (two) hours as needed for migraine. May repeat in 2 hours if headache persists or recurs.   Past Medical History:  Diagnosis Date  . Migraine   . TMJ (dislocation of temporomandibular joint)   . Vertigo    Past Surgical History:  Procedure Laterality Date  . APPENDECTOMY    . TUBAL LIGATION     Social History   Social History Narrative   Divorced, 2 sons born in [id number] in [id number] and 1 daughter born [id number].  Her children are in [place].  She sells memberships to [employer]   1 caffeinated beverage daily no drugs tobacco.   Occasional alcohol   There is some family history of diabetes   Review of Systems As per HPI.  Migraine headaches.  Sometimes triggered when her mouth is open for a long time like at the dentist because she has TMJ.  All other review of systems are negative.  Objective:   Physical Exam @BP  108/64   Pulse 98   Ht  5' 7.5" (1.715 m)   Wt 199 lb (90.3 kg)   LMP 09/16/2017   BMI 30.71 kg/m @  General:  Well-developed, well-nourished and in no acute distress Eyes:  anicteric. ENT:   Mouth and posterior pharynx free of lesions.  Neck:   supple w/o thyromegaly or mass.  Lungs: Clear to auscultation bilaterally. Heart:  S1S2, no rubs, murmurs, gallops. Abdomen:  soft, non-tender, no hepatosplenomegaly, hernia, or mass and BS+.  Lymph:  no cervical or supraclavicular adenopathy. Extremities:   no edema, cyanosis or clubbing Skin   no rash. Neuro:  A&O x 3.  Psych:  appropriate mood and  Affect.   Data Reviewed: ER  visit notes labs studies as in HPI

## 2017-09-27 NOTE — Patient Instructions (Signed)

## 2017-10-09 ENCOUNTER — Encounter: Payer: Self-pay | Admitting: Internal Medicine

## 2017-10-09 ENCOUNTER — Other Ambulatory Visit: Payer: Self-pay

## 2017-10-09 ENCOUNTER — Ambulatory Visit (AMBULATORY_SURGERY_CENTER): Payer: 59 | Admitting: Internal Medicine

## 2017-10-09 VITALS — BP 124/74 | HR 72 | Temp 98.7°F | Resp 16 | Ht 67.5 in | Wt 199.0 lb

## 2017-10-09 DIAGNOSIS — K222 Esophageal obstruction: Secondary | ICD-10-CM | POA: Diagnosis not present

## 2017-10-09 DIAGNOSIS — R1310 Dysphagia, unspecified: Secondary | ICD-10-CM

## 2017-10-09 MED ORDER — SODIUM CHLORIDE 0.9 % IV SOLN: 500.0000 mL | Freq: Once | INTRAVENOUS | Status: DC

## 2017-10-09 NOTE — Patient Instructions (Addendum)
I dilated the esophagus to see if that helps. I did not see anything bad =- ? Of mild narrowing where esophagus and stomach meet. No signs of cancer or ulcers.  I recommend that you finish the pantoprazole prescription and if your problems are not gone after that let me know and we can consider additional testing based upon your symptoms.  I appreciate the opportunity to care for you. Iva Booparl E. Stacey Sago, MD, [facility]  Handouts given for dilation diet and esophagitis and stricture  YOU HAD AN ENDOSCOPIC PROCEDURE TODAY AT THE [facility]:   Refer to the procedure report that was given to you for any specific questions about what was found during the examination.  If the procedure report does not answer your questions, please call your gastroenterologist to clarify.  If you requested that your care partner not be given the details of your procedure findings, then the procedure report has been included in a sealed envelope for you to review at your convenience later.  YOU SHOULD EXPECT: Some feelings of bloating in the abdomen. Passage of more gas than usual.  Walking can help get rid of the air that was put into your GI tract during the procedure and reduce the bloating. If you had a lower endoscopy (such as a colonoscopy or flexible sigmoidoscopy) you may notice spotting of blood in your stool or on the toilet paper. If you underwent a bowel prep for your procedure, you may not have a normal bowel movement for a few days.  Please Note:  You might notice some irritation and congestion in your nose or some drainage.  This is from the oxygen used during your procedure.  There is no need for concern and it should clear up in a day or so.  SYMPTOMS TO REPORT IMMEDIATELY:   Following upper endoscopy (EGD)  Vomiting of blood or coffee ground material  New chest pain or pain under the shoulder blades  Painful or persistently difficult swallowing  New shortness of breath  Fever of 100F  or higher  Black, tarry-looking stools  For urgent or emergent issues, a gastroenterologist can be reached at any hour by calling [phone].   DIET:  We do recommend a small meal at first, but then you may proceed to your regular diet.  Drink plenty of fluids but you should avoid alcoholic beverages for 24 hours.  ACTIVITY:  You should plan to take it easy for the rest of today and you should NOT DRIVE or use heavy machinery until tomorrow (because of the sedation medicines used during the test).    FOLLOW UP: Our staff will call the number listed on your records the next business day following your procedure to check on you and address any questions or concerns that you may have regarding the information given to you following your procedure. If we do not reach you, we will leave a message.  However, if you are feeling well and you are not experiencing any problems, there is no need to return our call.  We will assume that you have returned to your regular daily activities without incident.  If any biopsies were taken you will be contacted by phone or by letter within the next 1-3 weeks.  Please call us at [phone] if you have not heard about the biopsies in 3 weeks.    SIGNATURES/CONFIDENTIALITY: You and/or your care partner have signed paperwork which will be entered into your electronic medical record.  These signatures  attest to the fact that that the information above on your After Visit Summary has been reviewed and is understood.  Full responsibility of the confidentiality of this discharge information lies with you and/or your care-partner.

## 2017-10-09 NOTE — Progress Notes (Signed)
Pt's states no medical or surgical changes since previsit or office visit. 

## 2017-10-09 NOTE — Op Note (Signed)
[facility] Patient Name: Rebecca Briggs Procedure Date: 10/09/2017 10:29 AM MRN: [id number] Endoscopist: Iva Booparl E Lan Entsminger , MD Age: [age] Referring MD:  Date of Birth: August 30, 1975 Gender: Female Account #: [id number] Procedure:                Upper GI endoscopy Indications:              Dysphagia Medicines:                Propofol per Anesthesia, Monitored Anesthesia Care Procedure:                Pre-Anesthesia Assessment:                           - Prior to the procedure, a History and Physical                            was performed, and patient medications and                            allergies were reviewed. The patient's tolerance of                            previous anesthesia was also reviewed. The risks                            and benefits of the procedure and the sedation                            options and risks were discussed with the patient.                            All questions were answered, and informed consent                            was obtained. Prior Anticoagulants: The patient has                            taken no previous anticoagulant or antiplatelet                            agents. ASA Grade Assessment: II - A patient with                            mild systemic disease. After reviewing the risks                            and benefits, the patient was deemed in                            satisfactory condition to undergo the procedure.                           After obtaining informed consent, the endoscope was  passed under direct vision. Throughout the                            procedure, the patient's blood pressure, pulse, and                            oxygen saturations were monitored continuously. The                            Model GIF-[id number]) scope was introduced                            through the mouth, and advanced to the second part                            of duodenum. The upper  GI endoscopy was                            accomplished without difficulty. The patient                            tolerated the procedure well. Scope In: Scope Out: Findings:                 One mild benign-appearing, intrinsic stenosis was                            found at the gastroesophageal junction. And was                            traversed. The scope was withdrawn. Dilation was                            performed with a Maloney dilator with mild                            resistance at 54 Fr. The dilation site was examined                            following endoscope reinsertion and showed mild                            improvement in luminal narrowing. Estimated blood                            loss: none.                           The exam was otherwise without abnormality.                           The cardia and gastric fundus were normal on                            retroflexion. Complications:  No immediate complications. Estimated Blood Loss:     Estimated blood loss: none. Impression:               - Benign-appearing esophageal stenosis. Dilated.                            Looked like parttial ring - no heme after dilation                            but did not see the partial ring afterward.                           - The examination was otherwise normal.                           - No specimens collected. Recommendation:           - Patient has a contact number available for                            emergencies. The signs and symptoms of potential                            delayed complications were discussed with the                            patient. Return to normal activities tomorrow.                            Written discharge instructions were provided to the                            patient.                           - Clear liquids x 1 hour then soft foods rest of                            day. Start prior diet tomorrow.                            - Continue present medications.                           - Finish pantoprazole Rx and if dysphagia not gone                            call me back for additional evaluation. Iva Boop, MD 10/09/2017 10:43:57 AM This report has been signed electronically.

## 2017-10-09 NOTE — Progress Notes (Signed)
To PACU, VSS. Report to [email or web address] 

## 2017-10-09 NOTE — Progress Notes (Signed)
Called to room to assist during endoscopic procedure.  Patient ID and intended procedure confirmed with present staff. Received instructions for my participation in the procedure from the performing physician.  

## 2017-10-10 ENCOUNTER — Telehealth: Payer: Self-pay | Admitting: *Deleted

## 2017-10-10 NOTE — Telephone Encounter (Signed)
I think it is most likely a back strain and not a complication - I did a relook and there was no tear with her dilation so as long as no problems with drinking and eating would observe, she can take acetaminophen or ibuprofen/Aleve and see PCP if needed  Call back prn

## 2017-10-10 NOTE — Telephone Encounter (Signed)
Spoke with pt and gave her Dr. Marvell FullerGessner's recommendations.  She states she will take something OTC to help with discomfort and will call back if needed.  She is having no trouble eating or drinking.

## 2017-10-10 NOTE — Telephone Encounter (Signed)
  Follow up Call-  Call back number 10/09/2017  Post procedure Call Back phone  # [phone]  Permission to leave phone message Yes     Patient questions:  Do you have a fever, pain , or abdominal swelling? Yes.   Pain Score  5 *  Have you tolerated food without any problems? Yes.    Have you been able to return to your normal activities? Yes.    Do you have any questions about your discharge instructions: Diet   No. Medications  No. Follow up visit  No.  Do you have questions or concerns about your Care? No.  Actions: * If pain score is 4 or above: Physician/ provider Notified : Stan Headarl Gessner, MD.  Dr. Leone PayorGessner.  When I spoke to the patient this a.m., she c/o pain between her shoulder blades.  She rated the pain as a "5."  No trouble following dilation diet yesterday.  No throat discomfort or SOB.  She said it "feels like a strain right between my shoulder blades."  Please advise, Baxter HireKristen

## 2017-10-10 NOTE — Telephone Encounter (Signed)
LMOM, will attempt to reach pt later

## 2019-07-01 ENCOUNTER — Other Ambulatory Visit: Payer: Self-pay

## 2019-07-01 DIAGNOSIS — Z20822 Contact with and (suspected) exposure to covid-19: Secondary | ICD-10-CM

## 2019-07-02 LAB — NOVEL CORONAVIRUS, NAA: SARS-CoV-2, NAA: NOT DETECTED

## 2019-07-30 ENCOUNTER — Other Ambulatory Visit: Payer: Self-pay

## 2019-07-30 DIAGNOSIS — Z20822 Contact with and (suspected) exposure to covid-19: Secondary | ICD-10-CM

## 2019-08-01 LAB — NOVEL CORONAVIRUS, NAA: SARS-CoV-2, NAA: NOT DETECTED

## 2019-10-14 ENCOUNTER — Ambulatory Visit: Payer: HRSA Program | Attending: Internal Medicine

## 2019-10-14 DIAGNOSIS — Z20822 Contact with and (suspected) exposure to covid-19: Secondary | ICD-10-CM | POA: Diagnosis not present

## 2019-10-15 ENCOUNTER — Ambulatory Visit: Payer: 59

## 2019-10-15 LAB — NOVEL CORONAVIRUS, NAA: SARS-CoV-2, NAA: NOT DETECTED

## 2020-07-22 ENCOUNTER — Emergency Department (HOSPITAL_BASED_OUTPATIENT_CLINIC_OR_DEPARTMENT_OTHER)
Admission: EM | Admit: 2020-07-22 | Discharge: 2020-07-22 | Disposition: A | Discharge status: Home / Self Care | Payer: BC Managed Care – PPO | Attending: Emergency Medicine | Admitting: Emergency Medicine

## 2020-07-22 ENCOUNTER — Encounter (HOSPITAL_BASED_OUTPATIENT_CLINIC_OR_DEPARTMENT_OTHER): Payer: Self-pay | Admitting: Emergency Medicine

## 2020-07-22 ENCOUNTER — Other Ambulatory Visit: Payer: Self-pay

## 2020-07-22 ENCOUNTER — Emergency Department (HOSPITAL_BASED_OUTPATIENT_CLINIC_OR_DEPARTMENT_OTHER): Payer: BC Managed Care – PPO

## 2020-07-22 DIAGNOSIS — R0789 Other chest pain: Secondary | ICD-10-CM

## 2020-07-22 LAB — BASIC METABOLIC PANEL
Anion gap: 8 (ref 5–15)
BUN: 11 mg/dL (ref 6–20)
CO2: 26 mmol/L (ref 22–32)
Calcium: 9.2 mg/dL (ref 8.9–10.3)
Chloride: 103 mmol/L (ref 98–111)
Creatinine, Ser: 0.75 mg/dL (ref 0.44–1.00)
GFR, Estimated: >60 mL/min (ref >60)
Glucose, Bld: 98 mg/dL (ref 70–99)
Potassium: 4.4 mmol/L (ref 3.5–5.1)
Sodium: 137 mmol/L (ref 135–145)

## 2020-07-22 LAB — CBC WITH DIFFERENTIAL/PLATELET
Abs Immature Granulocytes: 0 10*3/uL (ref 0.00–0.07)
Basophils Absolute: 0 10*3/uL (ref 0.0–0.1)
Basophils Relative: 1 %
Eosinophils Absolute: 0.1 10*3/uL (ref 0.0–0.5)
Eosinophils Relative: 1 %
HCT: 40.6 % (ref 36.0–46.0)
Hemoglobin: 13.8 g/dL (ref 12.0–15.0)
Immature Granulocytes: 0 %
Lymphocytes Relative: 33 %
Lymphs Abs: 1.6 10*3/uL (ref 0.7–4.0)
MCH: 30.4 pg (ref 26.0–34.0)
MCHC: 34 g/dL (ref 30.0–36.0)
MCV: 89.4 fL (ref 80.0–100.0)
Monocytes Absolute: 0.3 10*3/uL (ref 0.1–1.0)
Monocytes Relative: 7 %
Neutro Abs: 2.7 10*3/uL (ref 1.7–7.7)
Neutrophils Relative %: 58 %
Platelets: 165 10*3/uL (ref 150–400)
RBC: 4.54 MIL/uL (ref 3.87–5.11)
RDW: 12 % (ref 11.5–15.5)
WBC: 4.7 10*3/uL (ref 4.0–10.5)
nRBC: 0 % (ref 0.0–0.2)

## 2020-07-22 MED ORDER — IOHEXOL 350 MG/ML SOLN
100.0000 mL | Freq: Once | INTRAVENOUS | Status: AC
  Administered 2020-07-22: 80 mL via INTRAVENOUS

## 2020-07-22 NOTE — Discharge Instructions (Addendum)
Continue to follow-up with your regular doctors.  Also would maybe recommend consultation with pulmonary medicine discussed that with your primary care doctor.  Continue to follow-up with cardiology.  Today CT of the chest without any acute findings at all and certainly no evidence of a pulmonary embolus.

## 2020-07-22 NOTE — ED Triage Notes (Signed)
Pt had covid in august.  Pt has been losing hair.  Pt has been having chest pain since October.  Some sob, some burning and some left arm weakness/feels funny. Pt seen by PCP, cardiologist and had blood work drawn Wednesday.  Pt states they told her she needs a CT to rule out PE.  Pt states her insurance has not approved one yet and because she was having left arm issues, she came today.

## 2020-07-22 NOTE — ED Provider Notes (Signed)
[facility] EMERGENCY DEPARTMENT Provider Note   CSN: [id number] Arrival date & time: 07/22/20  1009     History Chief Complaint  Patient presents with  . Chest Pain    Ed Rebecca Briggs is a 44 y.o. female.  Patient sent in by primary care doctor to get a CT angio chest.  Patient had Covid in August and since that time is been having some persistent intermittent chest pain is being followed by cardiology with monitoring.  She sometimes has some shortness of breath and some burning and some left arm weakness and feeling funny.  Patient apparently had elevated D-dimer and was sent in to have CT to rule out PE.  Patient nontoxic no acute distress        Past Medical History:  Diagnosis Date  . Migraine   . TMJ (dislocation of temporomandibular joint)   . Vertigo     There are no problems to display for this patient.   Past Surgical History:  Procedure Laterality Date  . APPENDECTOMY    . TUBAL LIGATION       OB History   No obstetric history on file.     No family history on file.  Social History   Tobacco Use  . Smoking status: Never Smoker  . Smokeless tobacco: Never Used  Substance Use Topics  . Alcohol use: Yes    Comment: occ  . Drug use: No    Home Medications Prior to Admission medications   Not on File    Allergies    Patient has no known allergies.  Review of Systems   Review of Systems  Constitutional: Negative for chills and fever.  HENT: Negative for congestion, rhinorrhea and sore throat.   Eyes: Negative for visual disturbance.  Respiratory: Positive for shortness of breath. Negative for cough.   Cardiovascular: Positive for chest pain. Negative for leg swelling.  Gastrointestinal: Negative for abdominal pain, diarrhea, nausea and vomiting.  Genitourinary: Negative for dysuria.  Musculoskeletal: Negative for back pain and neck pain.  Skin: Negative for rash.  Neurological: Positive for weakness. Negative for dizziness,  light-headedness and headaches.  Hematological: Does not bruise/bleed easily.  Psychiatric/Behavioral: Negative for confusion.    Physical Exam Updated Vital Signs BP 110/70   Pulse 81   Temp 98.2 F (36.8 C) (Oral)   Resp 18   Ht 1.626 m (5\' 4" )   Wt 98.9 kg   LMP 06/27/2020   SpO2 98%   BMI 37.42 kg/m   Physical Exam Vitals and nursing note reviewed.  Constitutional:      General: She is not in acute distress.    Appearance: Normal appearance. She is well-developed.  HENT:     Head: Normocephalic and atraumatic.  Eyes:     Extraocular Movements: Extraocular movements intact.     Conjunctiva/sclera: Conjunctivae normal.     Pupils: Pupils are equal, round, and reactive to light.  Cardiovascular:     Rate and Rhythm: Normal rate and regular rhythm.     Heart sounds: No murmur heard.   Pulmonary:     Effort: Pulmonary effort is normal. No respiratory distress.     Breath sounds: Normal breath sounds.  Abdominal:     Palpations: Abdomen is soft.     Tenderness: There is no abdominal tenderness.  Musculoskeletal:        General: No swelling. Normal range of motion.     Cervical back: Normal range of motion and neck supple.  Skin:  General: Skin is warm and dry.     Capillary Refill: Capillary refill takes less than 2 seconds.  Neurological:     General: No focal deficit present.     Mental Status: She is alert and oriented to person, place, and time.     Cranial Nerves: No cranial nerve deficit.     Sensory: No sensory deficit.     Motor: No weakness.     ED Results / Procedures / Treatments   Labs (all labs ordered are listed, but only abnormal results are displayed) Labs Reviewed  CBC WITH DIFFERENTIAL/PLATELET  BASIC METABOLIC PANEL    EKG EKG Interpretation  Date/Time:  Friday July 22 2020 10:17:10 EST Ventricular Rate:  93 PR Interval:  126 QRS Duration: 70 QT Interval:  358 QTC Calculation: 445 R Axis:   47 Text Interpretation: Normal  sinus rhythm with sinus arrhythmia Low voltage QRS Borderline ECG No significant change since last tracing Confirmed by Vanetta Mulders [phone]) on 07/22/2020 10:27:56 AM   Radiology CT Angio Chest PE W and/or Wo Contrast  Result Date: 07/22/2020 CLINICAL DATA:  Chest pain EXAM: CT ANGIOGRAPHY CHEST WITH CONTRAST TECHNIQUE: Multidetector CT imaging of the chest was performed using the standard protocol during bolus administration of intravenous contrast. Multiplanar CT image reconstructions and MIPs were obtained to evaluate the vascular anatomy. CONTRAST:  83mL OMNIPAQUE IOHEXOL 350 MG/ML SOLN COMPARISON:  None. FINDINGS: Cardiovascular: Satisfactory opacification of the pulmonary arteries to the segmental level. No evidence of pulmonary embolism. Normal heart size. No pericardial effusion. Mediastinum/Nodes: No enlarged lymph nodes. Thyroid and esophagus are unremarkable. Lungs/Pleura: No consolidation or mass. No pleural effusion or pneumothorax. Upper Abdomen: Unremarkable. Musculoskeletal: No acute osseous abnormality. Review of the MIP images confirms the above findings. IMPRESSION: No acute pulmonary embolism or other acute abnormality. Electronically Signed   By: Guadlupe Spanish M.D.   On: 07/22/2020 13:18    Procedures Procedures (including critical care time)  Medications Ordered in ED Medications  iohexol (OMNIPAQUE) 350 MG/ML injection 100 mL (80 mLs Intravenous Contrast Given 07/22/20 1228)    ED Course  I have reviewed the triage vital signs and the nursing notes.  Pertinent labs & imaging results that were available during my care of the patient were reviewed by me and considered in my medical decision making (see chart for details).    MDM Rules/Calculators/A&P                             CT angio without evidence of any acute cardiopulmonary findings and no evidence of pulmonary embolus.  Patient's labs here without any significant abnormalities.  Will discharge patient  home follow back up with cardiology as well as her primary care doctor.  There may be once some consideration for may be consultation with pulmonary medicine as well.  Patient stable for discharge home Final Clinical Impression(s) / ED Diagnoses Final diagnoses:  Atypical chest pain    Rx / DC Orders ED Discharge Orders    None       Vanetta Mulders, MD 07/22/20 [phone]

## 2020-11-03 LAB — HM PAP SMEAR

## 2021-02-23 IMAGING — CT CT ANGIO CHEST
2 of 8 series · 19 of 36 positions shown · IV contrast (Omnipaque)
Comparison: None.

CLINICAL DATA: Chest pain

EXAM:
CT ANGIOGRAPHY CHEST WITH CONTRAST
TECHNIQUE: Multidetector CT imaging of the chest was performed using the
standard protocol during bolus administration of intravenous
contrast. Multiplanar CT image reconstructions and MIPs were
obtained to evaluate the vascular anatomy.
CONTRAST:  80mL OMNIPAQUE IOHEXOL 350 MG/ML SOLN

[Series 6: pe thins · axial · 0.74mm/px · z∈[-165,+132]mm · 18 of 333 slices shown]
[im 18/333  lung]
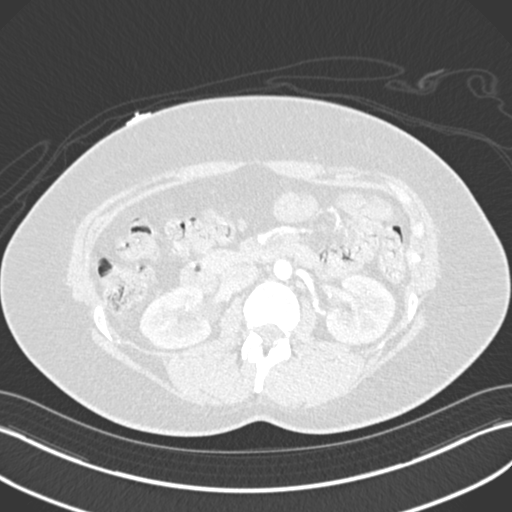
[im 35/333  mediastinal]
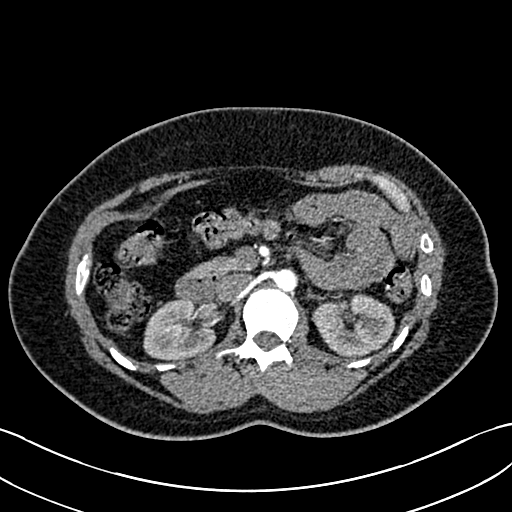
[im 53/333  lung]
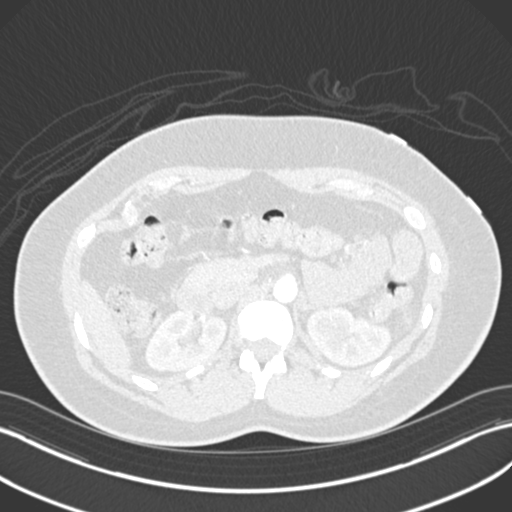
[im 70/333  mediastinal]
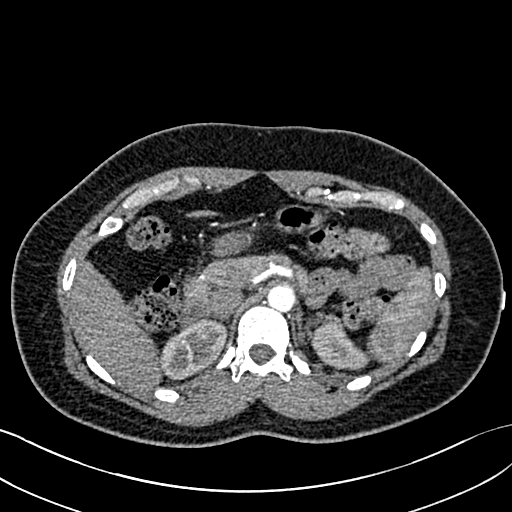
[im 88/333  lung]
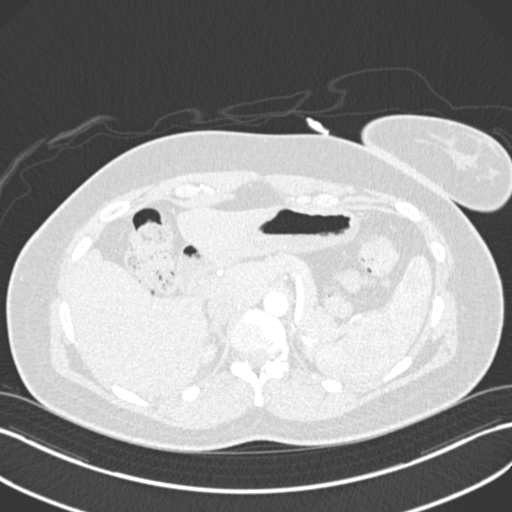
[im 105/333  mediastinal]
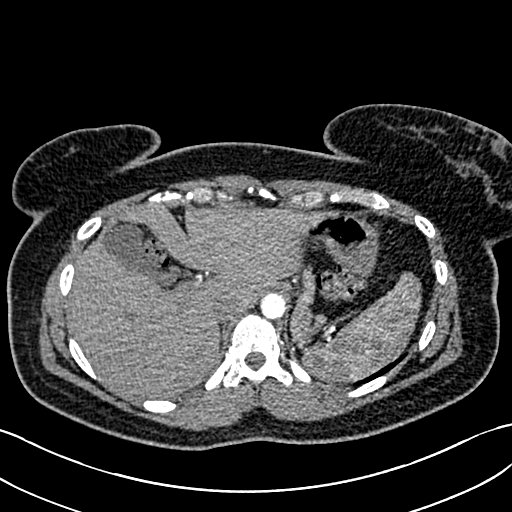
[im 123/333  lung]
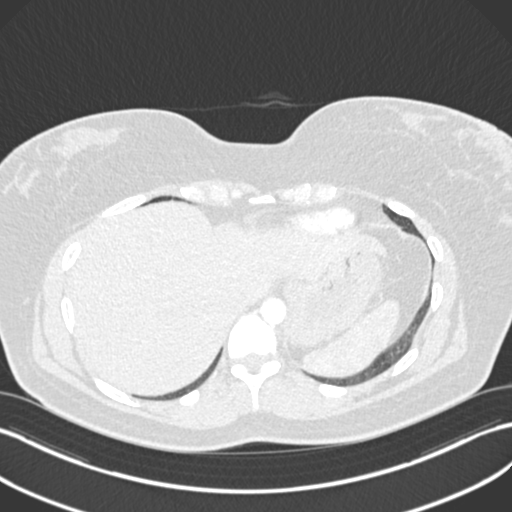
[im 140/333  mediastinal]
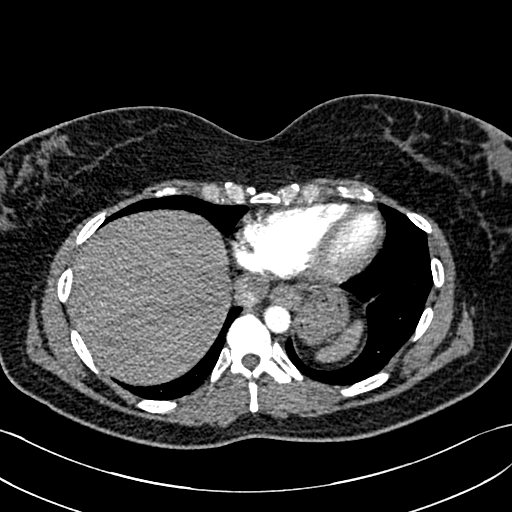
[im 158/333  lung]
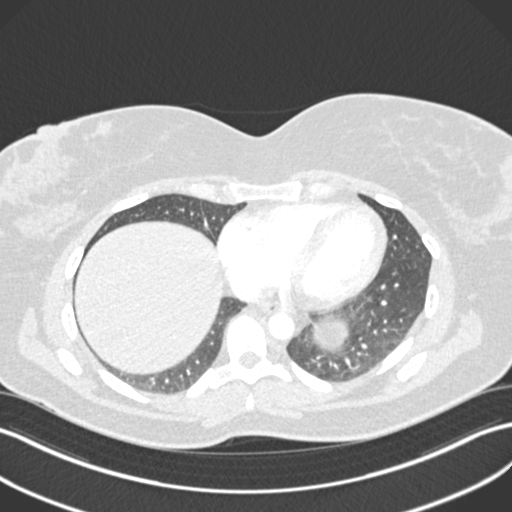
[im 175/333  mediastinal]
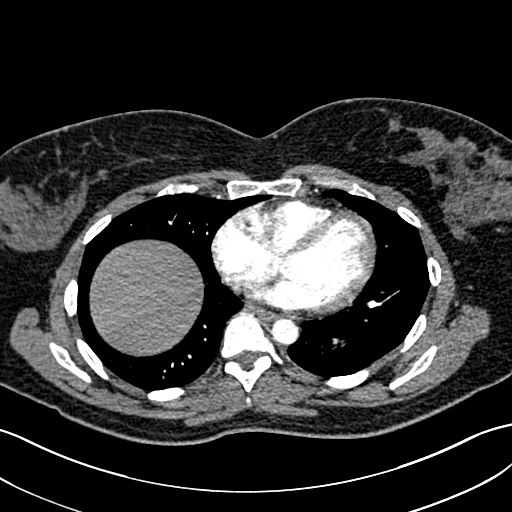
[im 193/333  lung]
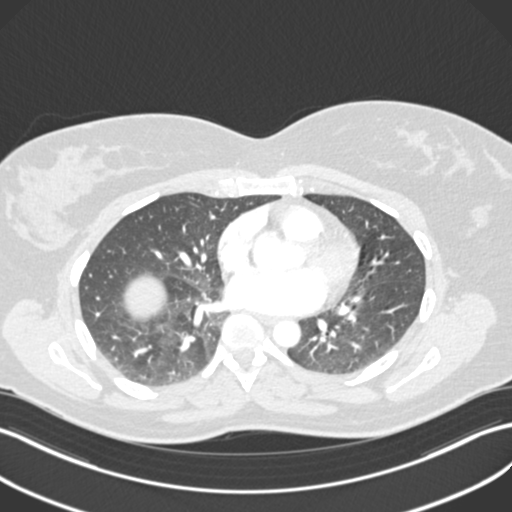
[im 210/333  mediastinal]
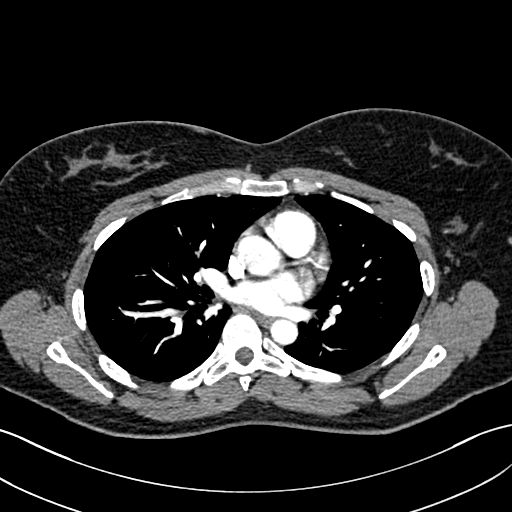
[im 228/333  lung]
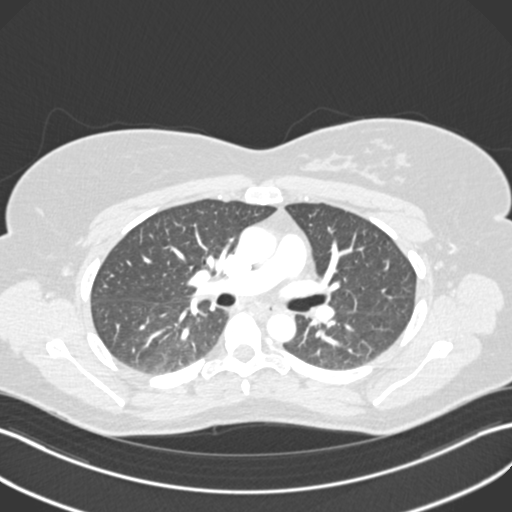
[im 245/333  mediastinal]
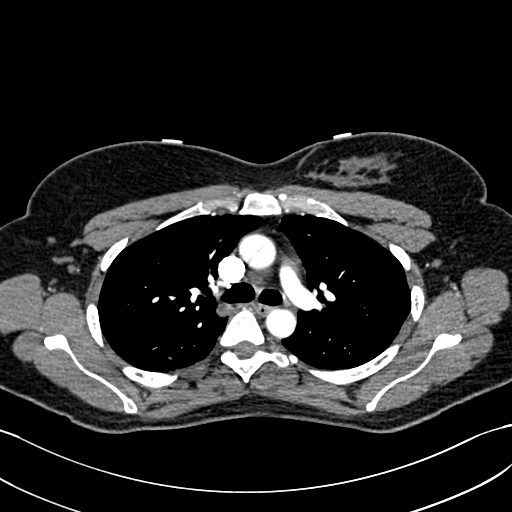
[im 263/333  lung]
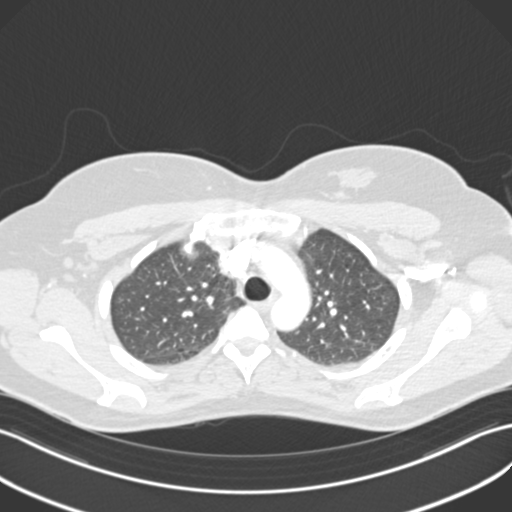
[im 280/333  mediastinal]
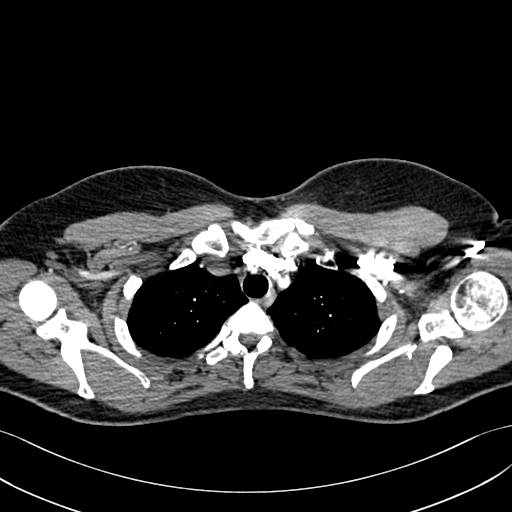
[im 298/333  lung]
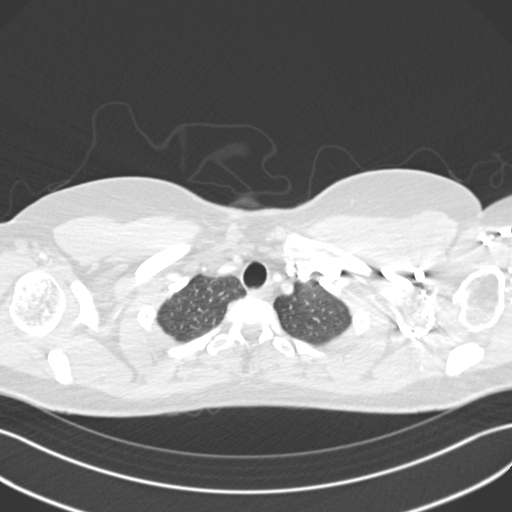
[im 315/333  mediastinal]
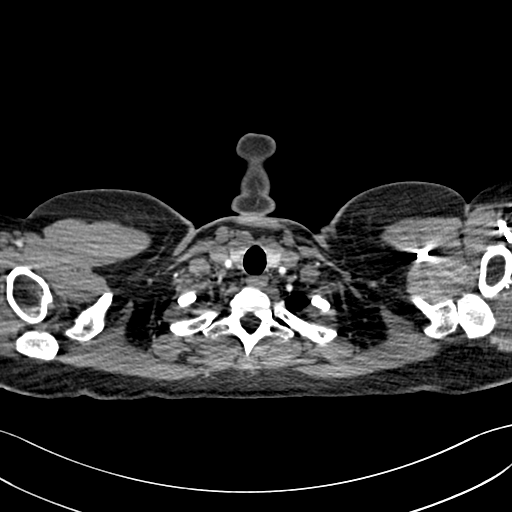

[Series 7: pe coronal mpr · coronal · 0.65mm/px · 1 of 118 slices shown]
[im 59/118  mediastinal]
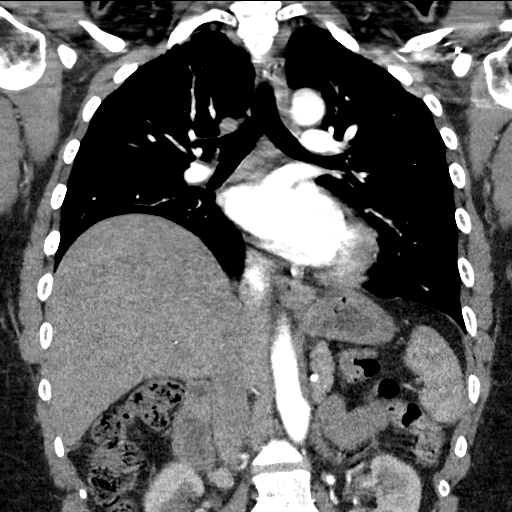

[19 of 36 positions shown; findings below may reference images not displayed]

FINDINGS: Cardiovascular: Satisfactory opacification of the pulmonary arteries
to the segmental level. No evidence of pulmonary embolism. Normal
heart size. No pericardial effusion.

Mediastinum/Nodes: No enlarged lymph nodes. Thyroid and esophagus
are unremarkable.

Lungs/Pleura: No consolidation or mass. No pleural effusion or
pneumothorax.

Upper Abdomen: Unremarkable.

Musculoskeletal: No acute osseous abnormality.

Review of the MIP images confirms the above findings.
IMPRESSION: No acute pulmonary embolism or other acute abnormality.

## 2021-12-11 DIAGNOSIS — K219 Gastro-esophageal reflux disease without esophagitis: Secondary | ICD-10-CM | POA: Diagnosis not present

## 2021-12-11 DIAGNOSIS — R5383 Other fatigue: Secondary | ICD-10-CM | POA: Diagnosis not present

## 2021-12-11 DIAGNOSIS — M25571 Pain in right ankle and joints of right foot: Secondary | ICD-10-CM | POA: Diagnosis not present

## 2021-12-11 DIAGNOSIS — G8929 Other chronic pain: Secondary | ICD-10-CM | POA: Diagnosis not present

## 2021-12-26 DIAGNOSIS — M7671 Peroneal tendinitis, right leg: Secondary | ICD-10-CM | POA: Diagnosis not present

## 2021-12-26 DIAGNOSIS — M25371 Other instability, right ankle: Secondary | ICD-10-CM | POA: Diagnosis not present

## 2021-12-26 HISTORY — DX: Peroneal tendinitis, right leg: M76.71

## 2022-01-04 DIAGNOSIS — M948X7 Other specified disorders of cartilage, ankle and foot: Secondary | ICD-10-CM | POA: Diagnosis not present

## 2022-01-04 DIAGNOSIS — M65861 Other synovitis and tenosynovitis, right lower leg: Secondary | ICD-10-CM | POA: Diagnosis not present

## 2022-01-04 DIAGNOSIS — M7731 Calcaneal spur, right foot: Secondary | ICD-10-CM | POA: Diagnosis not present

## 2022-01-16 DIAGNOSIS — M25371 Other instability, right ankle: Secondary | ICD-10-CM | POA: Diagnosis not present

## 2022-01-16 DIAGNOSIS — M65971 Unspecified synovitis and tenosynovitis, right ankle and foot: Secondary | ICD-10-CM | POA: Insufficient documentation

## 2022-01-16 DIAGNOSIS — M958 Other specified acquired deformities of musculoskeletal system: Secondary | ICD-10-CM | POA: Diagnosis not present

## 2022-01-16 DIAGNOSIS — M659 Synovitis and tenosynovitis, unspecified: Secondary | ICD-10-CM | POA: Diagnosis not present

## 2022-01-16 DIAGNOSIS — M25571 Pain in right ankle and joints of right foot: Secondary | ICD-10-CM | POA: Diagnosis not present

## 2022-01-16 HISTORY — DX: Unspecified synovitis and tenosynovitis, right ankle and foot: M65.971

## 2022-02-21 DIAGNOSIS — M958 Other specified acquired deformities of musculoskeletal system: Secondary | ICD-10-CM | POA: Diagnosis not present

## 2022-02-21 DIAGNOSIS — M659 Synovitis and tenosynovitis, unspecified: Secondary | ICD-10-CM | POA: Diagnosis not present

## 2022-02-22 DIAGNOSIS — M958 Other specified acquired deformities of musculoskeletal system: Secondary | ICD-10-CM | POA: Insufficient documentation

## 2022-02-22 HISTORY — DX: Other specified acquired deformities of musculoskeletal system: M95.8

## 2022-03-08 DIAGNOSIS — M958 Other specified acquired deformities of musculoskeletal system: Secondary | ICD-10-CM | POA: Diagnosis not present

## 2022-03-08 DIAGNOSIS — M93271 Osteochondritis dissecans, right ankle and joints of right foot: Secondary | ICD-10-CM | POA: Diagnosis not present

## 2022-03-23 DIAGNOSIS — Z4789 Encounter for other orthopedic aftercare: Secondary | ICD-10-CM | POA: Diagnosis not present

## 2022-07-30 ENCOUNTER — Encounter (HOSPITAL_BASED_OUTPATIENT_CLINIC_OR_DEPARTMENT_OTHER): Payer: Self-pay

## 2022-07-30 ENCOUNTER — Other Ambulatory Visit: Payer: Self-pay

## 2022-07-30 ENCOUNTER — Emergency Department (HOSPITAL_BASED_OUTPATIENT_CLINIC_OR_DEPARTMENT_OTHER)
Admission: EM | Admit: 2022-07-30 | Discharge: 2022-07-30 | Disposition: A | Discharge status: Home / Self Care | Payer: BC Managed Care – PPO | Attending: Emergency Medicine | Admitting: Emergency Medicine

## 2022-07-30 ENCOUNTER — Emergency Department (HOSPITAL_BASED_OUTPATIENT_CLINIC_OR_DEPARTMENT_OTHER): Payer: BC Managed Care – PPO

## 2022-07-30 DIAGNOSIS — J9801 Acute bronchospasm: Secondary | ICD-10-CM | POA: Diagnosis not present

## 2022-07-30 DIAGNOSIS — Z20822 Contact with and (suspected) exposure to covid-19: Secondary | ICD-10-CM | POA: Diagnosis not present

## 2022-07-30 DIAGNOSIS — J209 Acute bronchitis, unspecified: Secondary | ICD-10-CM | POA: Diagnosis not present

## 2022-07-30 DIAGNOSIS — M5412 Radiculopathy, cervical region: Secondary | ICD-10-CM | POA: Diagnosis not present

## 2022-07-30 DIAGNOSIS — R059 Cough, unspecified: Secondary | ICD-10-CM | POA: Diagnosis not present

## 2022-07-30 LAB — RESP PANEL BY RT-PCR (RSV, FLU A&B, COVID)  RVPGX2
Influenza A by PCR: NEGATIVE
Influenza B by PCR: NEGATIVE
Resp Syncytial Virus by PCR: NEGATIVE
SARS Coronavirus 2 by RT PCR: NEGATIVE

## 2022-07-30 MED ORDER — DEXAMETHASONE SODIUM PHOSPHATE 10 MG/ML IJ SOLN
10.0000 mg | Freq: Once | INTRAMUSCULAR | Status: AC
  Administered 2022-07-30: 10 mg via INTRAMUSCULAR
  Filled 2022-07-30: qty 1

## 2022-07-30 MED ORDER — ALBUTEROL SULFATE HFA 108 (90 BASE) MCG/ACT IN AERS
2.0000 | INHALATION_SPRAY | RESPIRATORY_TRACT | Status: DC | PRN
  Administered 2022-07-30 (×2): 2 via RESPIRATORY_TRACT
  Filled 2022-07-30: qty 6.7

## 2022-07-30 NOTE — ED Triage Notes (Signed)
Complaining of a cough for over a week. Now having pressure in the center of chest and burning/numbness in the left arm. Coughing up a yellow thick sputum.

## 2022-07-30 NOTE — ED Provider Notes (Signed)
[facility] Provider Note: Georgena Spurling, MD, FACEP  CSN: [id number] MRN: [id number] ARRIVAL: 07/30/22 at [place]: [place]  [place]ough   HISTORY OF PRESENT ILLNESS  07/30/22 5:09 AM Rebecca Briggs is a 46 y.o. female with flulike symptoms for over a week.  Specifically she had sore throat, body aches, nasal congestion and cough.  The cough has persisted and it has become productive of thick yellow sputum.    This morning she awakened after sleeping with her neck tilted to the right.  She noted paresthesias (tingling) and some burning down her left arm as well as in her left upper chest.  This is not worse with movement of her neck.  She cannot isolate the paresthesias to a particular dermatome.   Past Medical History:  Diagnosis Date   Migraine    TMJ (dislocation of temporomandibular joint)    Vertigo     Past Surgical History:  Procedure Laterality Date   APPENDECTOMY     TUBAL LIGATION      History reviewed. No pertinent family history.  Social History   Tobacco Use   Smoking status: Never   Smokeless tobacco: Never  Substance Use Topics   Alcohol use: Yes    Comment: occ   Drug use: No    Prior to Admission medications   Not on File    Allergies Patient has no known allergies.   REVIEW OF SYSTEMS  Negative except as noted here or in the History of Present Illness.   PHYSICAL EXAMINATION  Initial Vital Signs Blood pressure (!) 134/93, pulse 89, temperature 97.7 F (36.5 C), temperature source Oral, resp. rate 18, height 5\' 7"  (1.702 m), weight 97.5 kg, SpO2 100 %.  Examination General: Well-developed, well-nourished female in no acute distress; appearance consistent with age of record HENT: normocephalic; atraumatic Eyes: Normal appearance Neck: supple; movement of the neck does not reproduce paresthesias Heart: regular rate and rhythm Lungs: Decreased air movement bilaterally without frank wheezing Abdomen:  soft; nondistended; nontender; bowel sounds present Extremities: No deformity; full range of motion; pulses normal Neurologic: Awake, alert and oriented; motor function intact in all extremities and symmetric; no facial droop Skin: Warm and dry Psychiatric: Normal mood and affect   RESULTS  Summary of this visit's results, reviewed and interpreted by myself:   EKG Interpretation  Date/Time:    Ventricular Rate:    PR Interval:    QRS Duration:   QT Interval:    QTC Calculation:   R Axis:     Text Interpretation:         Laboratory Studies: Results for orders placed or performed during the hospital encounter of 07/30/22 (from the past 24 hour(s))  Resp panel by RT-PCR (RSV, Flu A&B, Covid) Anterior Nasal Swab     Status: None   Collection Time: 07/30/22  5:00 AM   Specimen: Anterior Nasal Swab  Result Value Ref Range   SARS Coronavirus 2 by RT PCR NEGATIVE NEGATIVE   Influenza A by PCR NEGATIVE NEGATIVE   Influenza B by PCR NEGATIVE NEGATIVE   Resp Syncytial Virus by PCR NEGATIVE NEGATIVE   Imaging Studies: DG Chest 2 View  Result Date: 07/30/2022 CLINICAL DATA:  46 year old female with history of cough. EXAM: CHEST - 2 VIEW COMPARISON:  Chest x-ray 09/19/2017. FINDINGS: Lung volumes are normal. No consolidative airspace disease. Mild eventration of the medial left hemidiaphragm incidentally noted. No pleural effusions. No pneumothorax. No pulmonary nodule or mass noted.  Pulmonary vasculature and the cardiomediastinal silhouette are within normal limits. IMPRESSION: No radiographic evidence of acute cardiopulmonary disease. Electronically Signed   By: Trudie Reed M.D.   On: 07/30/2022 05:28    ED COURSE and MDM  Nursing notes, initial and subsequent vitals signs, including pulse oximetry, reviewed and interpreted by myself.  Vitals:   07/30/22 0500 07/30/22 0502 07/30/22 0544  BP: (!) 134/93    Pulse: 89    Resp: 18    Temp: 97.7 F (36.5 C)    TempSrc: Oral     SpO2: 100%  98%  Weight:  97.5 kg   Height:  5\' 7"  (1.702 m)    Medications  albuterol (VENTOLIN HFA) 108 (90 Base) MCG/ACT inhaler 2 puff (2 puffs Inhalation Given 07/30/22 0529)  dexamethasone (DECADRON) injection 10 mg (has no administration in time range)   6:00 AM Air movement significantly improved after albuterol treatment.  Patient given albuterol inhaler and AeroChamber and instructed in their use.  We will also give a dose of steroids prior to discharge.  Presentation is consistent with a persistent bronchitis after a likely viral illness.  She is negative for tested viruses.  I suspect the paresthesias and burning in her left arm are due to a cervical radiculopathy.  If symptoms persist she was advised to follow-up with her PCP as an MRI may be indicated.   PROCEDURES  Procedures   ED DIAGNOSES     ICD-10-CM   1. Acute bronchitis with bronchospasm  J20.9     2. Cervical radiculopathy  M54.12          Blayke Pinera, 14/11/23, MD 07/30/22 [phone]

## 2022-09-20 ENCOUNTER — Emergency Department (HOSPITAL_COMMUNITY): Payer: BC Managed Care – PPO

## 2022-09-20 ENCOUNTER — Encounter (HOSPITAL_COMMUNITY): Payer: Self-pay

## 2022-09-20 ENCOUNTER — Emergency Department (HOSPITAL_COMMUNITY)
Admission: EM | Admit: 2022-09-20 | Discharge: 2022-09-20 | Disposition: A | Discharge status: Home / Self Care | Payer: BC Managed Care – PPO | Attending: Emergency Medicine | Admitting: Emergency Medicine

## 2022-09-20 ENCOUNTER — Other Ambulatory Visit: Payer: Self-pay

## 2022-09-20 DIAGNOSIS — S20219A Contusion of unspecified front wall of thorax, initial encounter: Secondary | ICD-10-CM | POA: Diagnosis not present

## 2022-09-20 DIAGNOSIS — Z041 Encounter for examination and observation following transport accident: Secondary | ICD-10-CM | POA: Diagnosis not present

## 2022-09-20 DIAGNOSIS — S0081XA Abrasion of other part of head, initial encounter: Secondary | ICD-10-CM | POA: Diagnosis not present

## 2022-09-20 DIAGNOSIS — S0990XA Unspecified injury of head, initial encounter: Secondary | ICD-10-CM | POA: Diagnosis not present

## 2022-09-20 DIAGNOSIS — S20212A Contusion of left front wall of thorax, initial encounter: Secondary | ICD-10-CM | POA: Diagnosis not present

## 2022-09-20 DIAGNOSIS — S29019A Strain of muscle and tendon of unspecified wall of thorax, initial encounter: Secondary | ICD-10-CM

## 2022-09-20 DIAGNOSIS — S29012A Strain of muscle and tendon of back wall of thorax, initial encounter: Secondary | ICD-10-CM | POA: Insufficient documentation

## 2022-09-20 DIAGNOSIS — S8992XA Unspecified injury of left lower leg, initial encounter: Secondary | ICD-10-CM | POA: Diagnosis not present

## 2022-09-20 DIAGNOSIS — M25511 Pain in right shoulder: Secondary | ICD-10-CM | POA: Insufficient documentation

## 2022-09-20 DIAGNOSIS — S299XXA Unspecified injury of thorax, initial encounter: Secondary | ICD-10-CM | POA: Diagnosis not present

## 2022-09-20 DIAGNOSIS — R0789 Other chest pain: Secondary | ICD-10-CM | POA: Diagnosis not present

## 2022-09-20 DIAGNOSIS — S199XXA Unspecified injury of neck, initial encounter: Secondary | ICD-10-CM | POA: Diagnosis not present

## 2022-09-20 DIAGNOSIS — S8012XA Contusion of left lower leg, initial encounter: Secondary | ICD-10-CM | POA: Diagnosis not present

## 2022-09-20 DIAGNOSIS — J341 Cyst and mucocele of nose and nasal sinus: Secondary | ICD-10-CM | POA: Diagnosis not present

## 2022-09-20 DIAGNOSIS — R519 Headache, unspecified: Secondary | ICD-10-CM | POA: Insufficient documentation

## 2022-09-20 DIAGNOSIS — Y92410 Unspecified street and highway as the place of occurrence of the external cause: Secondary | ICD-10-CM | POA: Insufficient documentation

## 2022-09-20 DIAGNOSIS — M25519 Pain in unspecified shoulder: Secondary | ICD-10-CM | POA: Diagnosis not present

## 2022-09-20 DIAGNOSIS — I10 Essential (primary) hypertension: Secondary | ICD-10-CM | POA: Diagnosis not present

## 2022-09-20 DIAGNOSIS — S301XXA Contusion of abdominal wall, initial encounter: Secondary | ICD-10-CM | POA: Diagnosis not present

## 2022-09-20 DIAGNOSIS — M79661 Pain in right lower leg: Secondary | ICD-10-CM | POA: Diagnosis not present

## 2022-09-20 LAB — CBC
HCT: 42.4 % (ref 36.0–46.0)
Hemoglobin: 14.3 g/dL (ref 12.0–15.0)
MCH: 30.6 pg (ref 26.0–34.0)
MCHC: 33.7 g/dL (ref 30.0–36.0)
MCV: 90.8 fL (ref 80.0–100.0)
Platelets: 185 10*3/uL (ref 150–400)
RBC: 4.67 MIL/uL (ref 3.87–5.11)
RDW: 11.9 % (ref 11.5–15.5)
WBC: 7.3 10*3/uL (ref 4.0–10.5)
nRBC: 0 % (ref 0.0–0.2)

## 2022-09-20 LAB — COMPREHENSIVE METABOLIC PANEL
ALT: 18 U/L (ref 0–44)
AST: 23 U/L (ref 15–41)
Albumin: 4.5 g/dL (ref 3.5–5.0)
Alkaline Phosphatase: 46 U/L (ref 38–126)
Anion gap: 8 (ref 5–15)
BUN: 11 mg/dL (ref 6–20)
CO2: 26 mmol/L (ref 22–32)
Calcium: 8.8 mg/dL — ABNORMAL LOW (ref 8.9–10.3)
Chloride: 103 mmol/L (ref 98–111)
Creatinine, Ser: 0.88 mg/dL (ref 0.44–1.00)
GFR, Estimated: >60 mL/min (ref >60)
Glucose, Bld: 106 mg/dL — ABNORMAL HIGH (ref 70–99)
Potassium: 3.9 mmol/L (ref 3.5–5.1)
Sodium: 137 mmol/L (ref 135–145)
Total Bilirubin: 0.5 mg/dL (ref 0.3–1.2)
Total Protein: 7.4 g/dL (ref 6.5–8.1)

## 2022-09-20 LAB — I-STAT CHEM 8, ED
BUN: 9 mg/dL (ref 6–20)
Calcium, Ion: 1.18 mmol/L (ref 1.15–1.40)
Chloride: 103 mmol/L (ref 98–111)
Creatinine, Ser: 0.7 mg/dL (ref 0.44–1.00)
Glucose, Bld: 100 mg/dL — ABNORMAL HIGH (ref 70–99)
HCT: 43 % (ref 36.0–46.0)
Hemoglobin: 14.6 g/dL (ref 12.0–15.0)
Potassium: 3.8 mmol/L (ref 3.5–5.1)
Sodium: 139 mmol/L (ref 135–145)
TCO2: 26 mmol/L (ref 22–32)

## 2022-09-20 MED ORDER — LIDOCAINE 5 % EX PTCH
1.0000 | MEDICATED_PATCH | CUTANEOUS | Status: DC
  Administered 2022-09-20: 1 via TRANSDERMAL
  Filled 2022-09-20: qty 1

## 2022-09-20 MED ORDER — KETOROLAC TROMETHAMINE 15 MG/ML IJ SOLN
15.0000 mg | Freq: Once | INTRAMUSCULAR | Status: AC
  Administered 2022-09-20: 15 mg via INTRAVENOUS
  Filled 2022-09-20: qty 1

## 2022-09-20 MED ORDER — MORPHINE SULFATE (PF) 4 MG/ML IV SOLN
4.0000 mg | Freq: Once | INTRAVENOUS | Status: AC
  Administered 2022-09-20: 4 mg via INTRAVENOUS
  Filled 2022-09-20: qty 1

## 2022-09-20 MED ORDER — IOHEXOL 300 MG/ML  SOLN
100.0000 mL | Freq: Once | INTRAMUSCULAR | Status: AC | PRN
  Administered 2022-09-20: 100 mL via INTRAVENOUS

## 2022-09-20 MED ORDER — METHOCARBAMOL 500 MG PO TABS
500.0000 mg | ORAL_TABLET | Freq: Four times a day (QID) | ORAL | 0 refills | Status: DC | PRN
  Filled 2022-09-20: qty 20, 5d supply, fill #0
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
## 2022-09-21 ENCOUNTER — Other Ambulatory Visit (HOSPITAL_BASED_OUTPATIENT_CLINIC_OR_DEPARTMENT_OTHER): Payer: Self-pay

## 2022-09-22 ENCOUNTER — Emergency Department (HOSPITAL_BASED_OUTPATIENT_CLINIC_OR_DEPARTMENT_OTHER): Payer: BC Managed Care – PPO

## 2022-09-22 ENCOUNTER — Emergency Department (HOSPITAL_BASED_OUTPATIENT_CLINIC_OR_DEPARTMENT_OTHER)
Admission: EM | Admit: 2022-09-22 | Discharge: 2022-09-22 | Disposition: A | Discharge status: Home / Self Care | Payer: BC Managed Care – PPO | Attending: Emergency Medicine | Admitting: Emergency Medicine

## 2022-09-22 ENCOUNTER — Encounter (HOSPITAL_BASED_OUTPATIENT_CLINIC_OR_DEPARTMENT_OTHER): Payer: Self-pay | Admitting: Emergency Medicine

## 2022-09-22 DIAGNOSIS — R9431 Abnormal electrocardiogram [ECG] [EKG]: Secondary | ICD-10-CM | POA: Diagnosis not present

## 2022-09-22 DIAGNOSIS — R0789 Other chest pain: Secondary | ICD-10-CM | POA: Diagnosis not present

## 2022-09-22 DIAGNOSIS — M25532 Pain in left wrist: Secondary | ICD-10-CM | POA: Diagnosis not present

## 2022-09-22 DIAGNOSIS — R109 Unspecified abdominal pain: Secondary | ICD-10-CM | POA: Insufficient documentation

## 2022-09-22 DIAGNOSIS — S60912A Unspecified superficial injury of left wrist, initial encounter: Secondary | ICD-10-CM | POA: Diagnosis not present

## 2022-09-22 DIAGNOSIS — Y92410 Unspecified street and highway as the place of occurrence of the external cause: Secondary | ICD-10-CM | POA: Diagnosis not present

## 2022-09-22 MED ORDER — LIDOCAINE 4 % EX PTCH: 1.0000 | MEDICATED_PATCH | CUTANEOUS | 0 refills | Status: AC

## 2022-09-22 MED ORDER — NAPROXEN 500 MG PO TABS: 500.0000 mg | ORAL_TABLET | Freq: Two times a day (BID) | ORAL | 0 refills | Status: DC

## 2022-09-22 MED ORDER — METHOCARBAMOL 750 MG PO TABS: ORAL_TABLET | ORAL | 0 refills | Status: DC
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
## 2022-09-25 DIAGNOSIS — R110 Nausea: Secondary | ICD-10-CM | POA: Diagnosis not present

## 2022-09-25 DIAGNOSIS — F419 Anxiety disorder, unspecified: Secondary | ICD-10-CM | POA: Diagnosis not present

## 2022-09-25 DIAGNOSIS — R52 Pain, unspecified: Secondary | ICD-10-CM | POA: Diagnosis not present

## 2022-10-10 DIAGNOSIS — M546 Pain in thoracic spine: Secondary | ICD-10-CM | POA: Diagnosis not present

## 2022-10-10 DIAGNOSIS — M25532 Pain in left wrist: Secondary | ICD-10-CM | POA: Diagnosis not present

## 2022-10-10 DIAGNOSIS — U071 COVID-19: Secondary | ICD-10-CM | POA: Diagnosis not present

## 2022-10-10 DIAGNOSIS — R42 Dizziness and giddiness: Secondary | ICD-10-CM | POA: Diagnosis not present

## 2022-10-12 DIAGNOSIS — M546 Pain in thoracic spine: Secondary | ICD-10-CM | POA: Diagnosis not present

## 2022-10-12 DIAGNOSIS — G8929 Other chronic pain: Secondary | ICD-10-CM | POA: Diagnosis not present

## 2022-10-12 DIAGNOSIS — M25532 Pain in left wrist: Secondary | ICD-10-CM | POA: Diagnosis not present

## 2022-10-18 DIAGNOSIS — S134XXD Sprain of ligaments of cervical spine, subsequent encounter: Secondary | ICD-10-CM | POA: Diagnosis not present

## 2022-10-18 DIAGNOSIS — S335XXA Sprain of ligaments of lumbar spine, initial encounter: Secondary | ICD-10-CM | POA: Diagnosis not present

## 2022-10-18 DIAGNOSIS — M9901 Segmental and somatic dysfunction of cervical region: Secondary | ICD-10-CM | POA: Diagnosis not present

## 2022-10-18 DIAGNOSIS — S233XXA Sprain of ligaments of thoracic spine, initial encounter: Secondary | ICD-10-CM | POA: Diagnosis not present

## 2022-10-22 DIAGNOSIS — S134XXD Sprain of ligaments of cervical spine, subsequent encounter: Secondary | ICD-10-CM | POA: Diagnosis not present

## 2022-10-22 DIAGNOSIS — S335XXA Sprain of ligaments of lumbar spine, initial encounter: Secondary | ICD-10-CM | POA: Diagnosis not present

## 2022-10-22 DIAGNOSIS — S233XXA Sprain of ligaments of thoracic spine, initial encounter: Secondary | ICD-10-CM | POA: Diagnosis not present

## 2022-10-22 DIAGNOSIS — M9901 Segmental and somatic dysfunction of cervical region: Secondary | ICD-10-CM | POA: Diagnosis not present

## 2022-10-24 DIAGNOSIS — S335XXA Sprain of ligaments of lumbar spine, initial encounter: Secondary | ICD-10-CM | POA: Diagnosis not present

## 2022-10-24 DIAGNOSIS — S134XXD Sprain of ligaments of cervical spine, subsequent encounter: Secondary | ICD-10-CM | POA: Diagnosis not present

## 2022-10-24 DIAGNOSIS — M9901 Segmental and somatic dysfunction of cervical region: Secondary | ICD-10-CM | POA: Diagnosis not present

## 2022-10-24 DIAGNOSIS — S233XXA Sprain of ligaments of thoracic spine, initial encounter: Secondary | ICD-10-CM | POA: Diagnosis not present

## 2022-10-29 DIAGNOSIS — S233XXA Sprain of ligaments of thoracic spine, initial encounter: Secondary | ICD-10-CM | POA: Diagnosis not present

## 2022-10-29 DIAGNOSIS — S335XXA Sprain of ligaments of lumbar spine, initial encounter: Secondary | ICD-10-CM | POA: Diagnosis not present

## 2022-10-29 DIAGNOSIS — S134XXD Sprain of ligaments of cervical spine, subsequent encounter: Secondary | ICD-10-CM | POA: Diagnosis not present

## 2022-10-29 DIAGNOSIS — M9901 Segmental and somatic dysfunction of cervical region: Secondary | ICD-10-CM | POA: Diagnosis not present

## 2022-10-30 DIAGNOSIS — S335XXA Sprain of ligaments of lumbar spine, initial encounter: Secondary | ICD-10-CM | POA: Diagnosis not present

## 2022-10-30 DIAGNOSIS — S233XXA Sprain of ligaments of thoracic spine, initial encounter: Secondary | ICD-10-CM | POA: Diagnosis not present

## 2022-10-30 DIAGNOSIS — S134XXD Sprain of ligaments of cervical spine, subsequent encounter: Secondary | ICD-10-CM | POA: Diagnosis not present

## 2022-10-30 DIAGNOSIS — M9901 Segmental and somatic dysfunction of cervical region: Secondary | ICD-10-CM | POA: Diagnosis not present

## 2022-11-01 DIAGNOSIS — S233XXA Sprain of ligaments of thoracic spine, initial encounter: Secondary | ICD-10-CM | POA: Diagnosis not present

## 2022-11-01 DIAGNOSIS — M9901 Segmental and somatic dysfunction of cervical region: Secondary | ICD-10-CM | POA: Diagnosis not present

## 2022-11-01 DIAGNOSIS — S134XXD Sprain of ligaments of cervical spine, subsequent encounter: Secondary | ICD-10-CM | POA: Diagnosis not present

## 2022-11-01 DIAGNOSIS — S335XXA Sprain of ligaments of lumbar spine, initial encounter: Secondary | ICD-10-CM | POA: Diagnosis not present

## 2022-11-05 DIAGNOSIS — S233XXA Sprain of ligaments of thoracic spine, initial encounter: Secondary | ICD-10-CM | POA: Diagnosis not present

## 2022-11-05 DIAGNOSIS — S335XXA Sprain of ligaments of lumbar spine, initial encounter: Secondary | ICD-10-CM | POA: Diagnosis not present

## 2022-11-05 DIAGNOSIS — S134XXD Sprain of ligaments of cervical spine, subsequent encounter: Secondary | ICD-10-CM | POA: Diagnosis not present

## 2022-11-05 DIAGNOSIS — M9901 Segmental and somatic dysfunction of cervical region: Secondary | ICD-10-CM | POA: Diagnosis not present

## 2022-11-06 DIAGNOSIS — S233XXA Sprain of ligaments of thoracic spine, initial encounter: Secondary | ICD-10-CM | POA: Diagnosis not present

## 2022-11-06 DIAGNOSIS — S134XXD Sprain of ligaments of cervical spine, subsequent encounter: Secondary | ICD-10-CM | POA: Diagnosis not present

## 2022-11-06 DIAGNOSIS — S335XXA Sprain of ligaments of lumbar spine, initial encounter: Secondary | ICD-10-CM | POA: Diagnosis not present

## 2022-11-06 DIAGNOSIS — M9901 Segmental and somatic dysfunction of cervical region: Secondary | ICD-10-CM | POA: Diagnosis not present

## 2022-11-08 DIAGNOSIS — M9901 Segmental and somatic dysfunction of cervical region: Secondary | ICD-10-CM | POA: Diagnosis not present

## 2022-11-08 DIAGNOSIS — S335XXA Sprain of ligaments of lumbar spine, initial encounter: Secondary | ICD-10-CM | POA: Diagnosis not present

## 2022-11-08 DIAGNOSIS — S233XXA Sprain of ligaments of thoracic spine, initial encounter: Secondary | ICD-10-CM | POA: Diagnosis not present

## 2022-11-08 DIAGNOSIS — S134XXD Sprain of ligaments of cervical spine, subsequent encounter: Secondary | ICD-10-CM | POA: Diagnosis not present

## 2022-11-09 DIAGNOSIS — R42 Dizziness and giddiness: Secondary | ICD-10-CM | POA: Diagnosis not present

## 2022-11-09 DIAGNOSIS — M542 Cervicalgia: Secondary | ICD-10-CM | POA: Diagnosis not present

## 2022-11-09 DIAGNOSIS — R2689 Other abnormalities of gait and mobility: Secondary | ICD-10-CM | POA: Diagnosis not present

## 2022-11-13 DIAGNOSIS — M9901 Segmental and somatic dysfunction of cervical region: Secondary | ICD-10-CM | POA: Diagnosis not present

## 2022-11-13 DIAGNOSIS — S335XXA Sprain of ligaments of lumbar spine, initial encounter: Secondary | ICD-10-CM | POA: Diagnosis not present

## 2022-11-13 DIAGNOSIS — S233XXA Sprain of ligaments of thoracic spine, initial encounter: Secondary | ICD-10-CM | POA: Diagnosis not present

## 2022-11-13 DIAGNOSIS — S134XXD Sprain of ligaments of cervical spine, subsequent encounter: Secondary | ICD-10-CM | POA: Diagnosis not present

## 2022-11-14 DIAGNOSIS — S134XXD Sprain of ligaments of cervical spine, subsequent encounter: Secondary | ICD-10-CM | POA: Diagnosis not present

## 2022-11-14 DIAGNOSIS — S233XXA Sprain of ligaments of thoracic spine, initial encounter: Secondary | ICD-10-CM | POA: Diagnosis not present

## 2022-11-14 DIAGNOSIS — M9901 Segmental and somatic dysfunction of cervical region: Secondary | ICD-10-CM | POA: Diagnosis not present

## 2022-11-14 DIAGNOSIS — S335XXA Sprain of ligaments of lumbar spine, initial encounter: Secondary | ICD-10-CM | POA: Diagnosis not present

## 2022-11-19 DIAGNOSIS — S134XXD Sprain of ligaments of cervical spine, subsequent encounter: Secondary | ICD-10-CM | POA: Diagnosis not present

## 2022-11-19 DIAGNOSIS — S233XXA Sprain of ligaments of thoracic spine, initial encounter: Secondary | ICD-10-CM | POA: Diagnosis not present

## 2022-11-19 DIAGNOSIS — M9901 Segmental and somatic dysfunction of cervical region: Secondary | ICD-10-CM | POA: Diagnosis not present

## 2022-11-19 DIAGNOSIS — S335XXA Sprain of ligaments of lumbar spine, initial encounter: Secondary | ICD-10-CM | POA: Diagnosis not present

## 2022-11-21 DIAGNOSIS — S134XXD Sprain of ligaments of cervical spine, subsequent encounter: Secondary | ICD-10-CM | POA: Diagnosis not present

## 2022-11-21 DIAGNOSIS — S233XXA Sprain of ligaments of thoracic spine, initial encounter: Secondary | ICD-10-CM | POA: Diagnosis not present

## 2022-11-21 DIAGNOSIS — M9901 Segmental and somatic dysfunction of cervical region: Secondary | ICD-10-CM | POA: Diagnosis not present

## 2022-11-21 DIAGNOSIS — S335XXA Sprain of ligaments of lumbar spine, initial encounter: Secondary | ICD-10-CM | POA: Diagnosis not present

## 2022-11-27 DIAGNOSIS — S233XXA Sprain of ligaments of thoracic spine, initial encounter: Secondary | ICD-10-CM | POA: Diagnosis not present

## 2022-11-27 DIAGNOSIS — S335XXA Sprain of ligaments of lumbar spine, initial encounter: Secondary | ICD-10-CM | POA: Diagnosis not present

## 2022-11-27 DIAGNOSIS — S134XXD Sprain of ligaments of cervical spine, subsequent encounter: Secondary | ICD-10-CM | POA: Diagnosis not present

## 2022-11-27 DIAGNOSIS — M9901 Segmental and somatic dysfunction of cervical region: Secondary | ICD-10-CM | POA: Diagnosis not present

## 2022-12-03 DIAGNOSIS — M9901 Segmental and somatic dysfunction of cervical region: Secondary | ICD-10-CM | POA: Diagnosis not present

## 2022-12-03 DIAGNOSIS — S134XXD Sprain of ligaments of cervical spine, subsequent encounter: Secondary | ICD-10-CM | POA: Diagnosis not present

## 2022-12-03 DIAGNOSIS — S335XXA Sprain of ligaments of lumbar spine, initial encounter: Secondary | ICD-10-CM | POA: Diagnosis not present

## 2022-12-03 DIAGNOSIS — S233XXA Sprain of ligaments of thoracic spine, initial encounter: Secondary | ICD-10-CM | POA: Diagnosis not present

## 2022-12-06 DIAGNOSIS — S233XXA Sprain of ligaments of thoracic spine, initial encounter: Secondary | ICD-10-CM | POA: Diagnosis not present

## 2022-12-06 DIAGNOSIS — S134XXD Sprain of ligaments of cervical spine, subsequent encounter: Secondary | ICD-10-CM | POA: Diagnosis not present

## 2022-12-06 DIAGNOSIS — M9901 Segmental and somatic dysfunction of cervical region: Secondary | ICD-10-CM | POA: Diagnosis not present

## 2022-12-06 DIAGNOSIS — S335XXA Sprain of ligaments of lumbar spine, initial encounter: Secondary | ICD-10-CM | POA: Diagnosis not present

## 2022-12-13 DIAGNOSIS — S335XXA Sprain of ligaments of lumbar spine, initial encounter: Secondary | ICD-10-CM | POA: Diagnosis not present

## 2022-12-13 DIAGNOSIS — M9901 Segmental and somatic dysfunction of cervical region: Secondary | ICD-10-CM | POA: Diagnosis not present

## 2022-12-13 DIAGNOSIS — S134XXD Sprain of ligaments of cervical spine, subsequent encounter: Secondary | ICD-10-CM | POA: Diagnosis not present

## 2022-12-13 DIAGNOSIS — S233XXA Sprain of ligaments of thoracic spine, initial encounter: Secondary | ICD-10-CM | POA: Diagnosis not present

## 2022-12-24 DIAGNOSIS — S233XXA Sprain of ligaments of thoracic spine, initial encounter: Secondary | ICD-10-CM | POA: Diagnosis not present

## 2022-12-24 DIAGNOSIS — S335XXA Sprain of ligaments of lumbar spine, initial encounter: Secondary | ICD-10-CM | POA: Diagnosis not present

## 2022-12-24 DIAGNOSIS — S134XXD Sprain of ligaments of cervical spine, subsequent encounter: Secondary | ICD-10-CM | POA: Diagnosis not present

## 2022-12-24 DIAGNOSIS — M9901 Segmental and somatic dysfunction of cervical region: Secondary | ICD-10-CM | POA: Diagnosis not present

## 2022-12-27 DIAGNOSIS — S233XXA Sprain of ligaments of thoracic spine, initial encounter: Secondary | ICD-10-CM | POA: Diagnosis not present

## 2022-12-27 DIAGNOSIS — M9901 Segmental and somatic dysfunction of cervical region: Secondary | ICD-10-CM | POA: Diagnosis not present

## 2022-12-27 DIAGNOSIS — S335XXA Sprain of ligaments of lumbar spine, initial encounter: Secondary | ICD-10-CM | POA: Diagnosis not present

## 2022-12-27 DIAGNOSIS — S134XXD Sprain of ligaments of cervical spine, subsequent encounter: Secondary | ICD-10-CM | POA: Diagnosis not present

## 2023-01-15 DIAGNOSIS — S134XXD Sprain of ligaments of cervical spine, subsequent encounter: Secondary | ICD-10-CM | POA: Diagnosis not present

## 2023-01-15 DIAGNOSIS — M9901 Segmental and somatic dysfunction of cervical region: Secondary | ICD-10-CM | POA: Diagnosis not present

## 2023-01-15 DIAGNOSIS — S233XXA Sprain of ligaments of thoracic spine, initial encounter: Secondary | ICD-10-CM | POA: Diagnosis not present

## 2023-01-15 DIAGNOSIS — S335XXA Sprain of ligaments of lumbar spine, initial encounter: Secondary | ICD-10-CM | POA: Diagnosis not present

## 2023-01-22 DIAGNOSIS — S335XXA Sprain of ligaments of lumbar spine, initial encounter: Secondary | ICD-10-CM | POA: Diagnosis not present

## 2023-01-22 DIAGNOSIS — M9901 Segmental and somatic dysfunction of cervical region: Secondary | ICD-10-CM | POA: Diagnosis not present

## 2023-01-22 DIAGNOSIS — S233XXA Sprain of ligaments of thoracic spine, initial encounter: Secondary | ICD-10-CM | POA: Diagnosis not present

## 2023-01-22 DIAGNOSIS — S134XXD Sprain of ligaments of cervical spine, subsequent encounter: Secondary | ICD-10-CM | POA: Diagnosis not present

## 2023-01-29 DIAGNOSIS — S335XXA Sprain of ligaments of lumbar spine, initial encounter: Secondary | ICD-10-CM | POA: Diagnosis not present

## 2023-01-29 DIAGNOSIS — M9901 Segmental and somatic dysfunction of cervical region: Secondary | ICD-10-CM | POA: Diagnosis not present

## 2023-01-29 DIAGNOSIS — S233XXA Sprain of ligaments of thoracic spine, initial encounter: Secondary | ICD-10-CM | POA: Diagnosis not present

## 2023-01-29 DIAGNOSIS — S134XXD Sprain of ligaments of cervical spine, subsequent encounter: Secondary | ICD-10-CM | POA: Diagnosis not present

## 2023-02-04 DIAGNOSIS — S335XXA Sprain of ligaments of lumbar spine, initial encounter: Secondary | ICD-10-CM | POA: Diagnosis not present

## 2023-02-04 DIAGNOSIS — S233XXA Sprain of ligaments of thoracic spine, initial encounter: Secondary | ICD-10-CM | POA: Diagnosis not present

## 2023-02-04 DIAGNOSIS — M9901 Segmental and somatic dysfunction of cervical region: Secondary | ICD-10-CM | POA: Diagnosis not present

## 2023-02-04 DIAGNOSIS — S134XXD Sprain of ligaments of cervical spine, subsequent encounter: Secondary | ICD-10-CM | POA: Diagnosis not present

## 2023-02-18 DIAGNOSIS — S233XXA Sprain of ligaments of thoracic spine, initial encounter: Secondary | ICD-10-CM | POA: Diagnosis not present

## 2023-02-18 DIAGNOSIS — S335XXA Sprain of ligaments of lumbar spine, initial encounter: Secondary | ICD-10-CM | POA: Diagnosis not present

## 2023-02-18 DIAGNOSIS — S134XXD Sprain of ligaments of cervical spine, subsequent encounter: Secondary | ICD-10-CM | POA: Diagnosis not present

## 2023-02-18 DIAGNOSIS — M9901 Segmental and somatic dysfunction of cervical region: Secondary | ICD-10-CM | POA: Diagnosis not present

## 2023-03-21 DIAGNOSIS — S335XXA Sprain of ligaments of lumbar spine, initial encounter: Secondary | ICD-10-CM | POA: Diagnosis not present

## 2023-03-21 DIAGNOSIS — S134XXD Sprain of ligaments of cervical spine, subsequent encounter: Secondary | ICD-10-CM | POA: Diagnosis not present

## 2023-03-21 DIAGNOSIS — S233XXA Sprain of ligaments of thoracic spine, initial encounter: Secondary | ICD-10-CM | POA: Diagnosis not present

## 2023-03-21 DIAGNOSIS — M9901 Segmental and somatic dysfunction of cervical region: Secondary | ICD-10-CM | POA: Diagnosis not present

## 2023-04-02 DIAGNOSIS — S335XXA Sprain of ligaments of lumbar spine, initial encounter: Secondary | ICD-10-CM | POA: Diagnosis not present

## 2023-04-02 DIAGNOSIS — S233XXA Sprain of ligaments of thoracic spine, initial encounter: Secondary | ICD-10-CM | POA: Diagnosis not present

## 2023-04-02 DIAGNOSIS — M9901 Segmental and somatic dysfunction of cervical region: Secondary | ICD-10-CM | POA: Diagnosis not present

## 2023-04-02 DIAGNOSIS — S134XXD Sprain of ligaments of cervical spine, subsequent encounter: Secondary | ICD-10-CM | POA: Diagnosis not present

## 2023-04-16 DIAGNOSIS — S134XXD Sprain of ligaments of cervical spine, subsequent encounter: Secondary | ICD-10-CM | POA: Diagnosis not present

## 2023-04-16 DIAGNOSIS — M9901 Segmental and somatic dysfunction of cervical region: Secondary | ICD-10-CM | POA: Diagnosis not present

## 2023-04-16 DIAGNOSIS — S335XXA Sprain of ligaments of lumbar spine, initial encounter: Secondary | ICD-10-CM | POA: Diagnosis not present

## 2023-04-16 DIAGNOSIS — S233XXA Sprain of ligaments of thoracic spine, initial encounter: Secondary | ICD-10-CM | POA: Diagnosis not present

## 2023-07-03 NOTE — Progress Notes (Deleted)
  [facility] CARE [facility] [address] [place] [address] Dept: [phone] Dept Fax: [phone]  New Patient Office Visit  Subjective:   Rebecca Briggs 07/20/1976 07/04/2023  No chief complaint on file.   HPI: Rebecca Briggs presents today to establish care at [facility] at [facility]. Patient was seen by PCP at prior location [facility]. This is not PCP's first encounter with patient.  Concerns: See below   Discussed the use of AI scribe software for clinical note transcription with the patient, who gave verbal consent to proceed.  History of Present Illness             The following portions of the patient's history were reviewed and updated as appropriate: past medical history, past surgical history, family history, social history, allergies, medications, and problem list.   There are no problems to display for this patient.  Past Medical History:  Diagnosis Date   Migraine    TMJ (dislocation of temporomandibular joint)    Vertigo    Past Surgical History:  Procedure Laterality Date   APPENDECTOMY     TUBAL LIGATION     No family history on file.  Current Outpatient Medications:    lidocaine 4 %, Place 1 patch onto the skin daily., Disp: 15 patch, Rfl: 0   methocarbamol (ROBAXIN) 750 MG tablet, For the first 3 days, take 2 tablets every 8 hours.  For the next 7 days, take 1 to 2 tablets every 8 hours as needed.  It is important to remember this may cause significant drowsiness, do not drive or operate machinery after taking this medication., Disp: 60 tablet, Rfl: 0   naproxen (NAPROSYN) 500 MG tablet, Take 1 tablet (500 mg total) by mouth 2 (two) times daily., Disp: 30 tablet, Rfl: 0 No Known Allergies  ROS: A complete ROS was performed with pertinent positives/negatives noted in the HPI. The remainder of the ROS are negative.   Objective:   There were no vitals filed for this  visit.  GENERAL: Well-appearing, in NAD. Well nourished.  SKIN: Pink, warm and dry. No rash, lesion, ulceration, or ecchymoses.  NECK: Trachea midline. Full ROM w/o pain or tenderness. No lymphadenopathy.  RESPIRATORY: Chest wall symmetrical. Respirations even and non-labored. Breath sounds clear to auscultation bilaterally.  CARDIAC: S1, S2 present, regular rate and rhythm. Peripheral pulses 2+ bilaterally.  MSK: Muscle tone and strength appropriate for age. Joints w/o tenderness, redness, or swelling.  EXTREMITIES: Without clubbing, cyanosis, or edema.  NEUROLOGIC: No motor or sensory deficits. Steady, even gait.  PSYCH/MENTAL STATUS: Alert, oriented x 3. Cooperative, appropriate mood and affect.   Health Maintenance Due  Topic Date Due   HIV Screening  Never done   Hepatitis C Screening  Never done   DTaP/Tdap/Td (1 - Tdap) Never done   Cervical Cancer Screening (HPV/Pap Cotest)  Never done   Colonoscopy  Never done   INFLUENZA VACCINE  Never done   COVID-19 Vaccine (1 - 2023-24 season) Never done    No results found for any visits on 07/04/23.  Assessment & Plan:  Assessment and Plan               There are no diagnoses linked to this encounter.  No orders of the defined types were placed in this encounter.  No orders of the defined types were placed in this encounter.   No follow-ups on file.   Salvatore Decent, FNP

## 2023-07-04 ENCOUNTER — Telehealth: Payer: Self-pay | Admitting: Internal Medicine

## 2023-07-04 ENCOUNTER — Ambulatory Visit: Payer: BC Managed Care – PPO | Admitting: Internal Medicine

## 2023-07-04 NOTE — Telephone Encounter (Signed)
Pt sent this over through PT Scheduling at 9:54   I tried calling the office to cancel. I have an EMG at work and can't make this time

## 2023-07-04 NOTE — Telephone Encounter (Signed)
Pt did not show up for appt. No letter sent yet.

## 2023-07-08 ENCOUNTER — Ambulatory Visit: Payer: BC Managed Care – PPO | Admitting: Internal Medicine

## 2023-07-09 NOTE — Telephone Encounter (Signed)
Prior pt of yours at [facility]. 1st missed visit. Sent letter via [employer].

## 2023-07-10 NOTE — Telephone Encounter (Signed)
Provider aware

## 2023-07-23 ENCOUNTER — Ambulatory Visit: Payer: BC Managed Care – PPO | Admitting: Internal Medicine

## 2023-07-23 VITALS — BP 130/82 | HR 99 | Temp 98.4°F | Ht 67.0 in | Wt 236.6 lb

## 2023-07-23 DIAGNOSIS — R635 Abnormal weight gain: Secondary | ICD-10-CM | POA: Diagnosis not present

## 2023-07-23 DIAGNOSIS — F321 Major depressive disorder, single episode, moderate: Secondary | ICD-10-CM | POA: Diagnosis not present

## 2023-07-23 DIAGNOSIS — F411 Generalized anxiety disorder: Secondary | ICD-10-CM | POA: Diagnosis not present

## 2023-07-23 DIAGNOSIS — L659 Nonscarring hair loss, unspecified: Secondary | ICD-10-CM | POA: Diagnosis not present

## 2023-07-23 DIAGNOSIS — F410 Panic disorder [episodic paroxysmal anxiety] without agoraphobia: Secondary | ICD-10-CM

## 2023-07-23 MED ORDER — ESCITALOPRAM OXALATE 10 MG PO TABS: 10.0000 mg | ORAL_TABLET | Freq: Every day | ORAL | 1 refills | Status: DC
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
## 2023-07-24 LAB — PROLACTIN: Prolactin: 14.3 ng/mL

## 2023-07-24 LAB — CBC WITH DIFFERENTIAL/PLATELET
Basophils Absolute: 0.1 10*3/uL (ref 0.0–0.1)
Basophils Relative: 1.3 % (ref 0.0–3.0)
Eosinophils Absolute: 0.1 10*3/uL (ref 0.0–0.7)
Eosinophils Relative: 1.5 % (ref 0.0–5.0)
HCT: 41.5 % (ref 36.0–46.0)
Hemoglobin: 14 g/dL (ref 12.0–15.0)
Lymphocytes Relative: 34.9 % (ref 12.0–46.0)
Lymphs Abs: 2 10*3/uL (ref 0.7–4.0)
MCHC: 33.7 g/dL (ref 30.0–36.0)
MCV: 91.3 fL (ref 78.0–100.0)
Monocytes Absolute: 0.4 10*3/uL (ref 0.1–1.0)
Monocytes Relative: 7.5 % (ref 3.0–12.0)
Neutro Abs: 3.2 10*3/uL (ref 1.4–7.7)
Neutrophils Relative %: 54.8 % (ref 43.0–77.0)
Platelets: 202 10*3/uL (ref 150.0–400.0)
RBC: 4.55 Mil/uL (ref 3.87–5.11)
RDW: 12.6 % (ref 11.5–15.5)
WBC: 5.8 10*3/uL (ref 4.0–10.5)

## 2023-07-24 LAB — COMPREHENSIVE METABOLIC PANEL
ALT: 14 U/L (ref 0–35)
AST: 17 U/L (ref 0–37)
Albumin: 4.2 g/dL (ref 3.5–5.2)
Alkaline Phosphatase: 51 U/L (ref 39–117)
BUN: 10 mg/dL (ref 6–23)
CO2: 27 meq/L (ref 19–32)
Calcium: 9.2 mg/dL (ref 8.4–10.5)
Chloride: 104 meq/L (ref 96–112)
Creatinine, Ser: 0.87 mg/dL (ref 0.40–1.20)
GFR: 79.04 mL/min (ref >60.00)
Glucose, Bld: 86 mg/dL (ref 70–99)
Potassium: 3.9 meq/L (ref 3.5–5.1)
Sodium: 140 meq/L (ref 135–145)
Total Bilirubin: 0.5 mg/dL (ref 0.2–1.2)
Total Protein: 7 g/dL (ref 6.0–8.3)

## 2023-07-24 LAB — IRON,TIBC AND FERRITIN PANEL
%SAT: 25 % (ref 16–45)
Ferritin: 24 ng/mL (ref 16–232)
Iron: 70 ug/dL (ref 40–190)
TIBC: 276 ug/dL (ref 250–450)

## 2023-07-24 LAB — TSH: TSH: 2.48 u[IU]/mL (ref 0.35–5.50)

## 2023-07-24 LAB — FOLLICLE STIMULATING HORMONE: FSH: 3.2 m[IU]/mL

## 2023-09-03 ENCOUNTER — Ambulatory Visit: Payer: BC Managed Care – PPO | Admitting: Internal Medicine

## 2023-10-02 ENCOUNTER — Telehealth (INDEPENDENT_AMBULATORY_CARE_PROVIDER_SITE_OTHER): Payer: BC Managed Care – PPO | Admitting: Internal Medicine

## 2023-10-02 DIAGNOSIS — F410 Panic disorder [episodic paroxysmal anxiety] without agoraphobia: Secondary | ICD-10-CM

## 2023-10-02 DIAGNOSIS — F411 Generalized anxiety disorder: Secondary | ICD-10-CM

## 2023-10-02 DIAGNOSIS — F321 Major depressive disorder, single episode, moderate: Secondary | ICD-10-CM

## 2023-10-02 DIAGNOSIS — J0100 Acute maxillary sinusitis, unspecified: Secondary | ICD-10-CM | POA: Diagnosis not present

## 2023-10-02 MED ORDER — AMOXICILLIN-POT CLAVULANATE 875-125 MG PO TABS: 1.0000 | ORAL_TABLET | Freq: Two times a day (BID) | ORAL | 0 refills | Status: AC

## 2023-10-02 MED ORDER — METHOCARBAMOL 750 MG PO TABS: 750.0000 mg | ORAL_TABLET | Freq: Three times a day (TID) | ORAL | 1 refills | Status: DC | PRN
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
## 2023-10-09 ENCOUNTER — Encounter: Payer: Self-pay | Admitting: Internal Medicine

## 2023-12-17 ENCOUNTER — Encounter: Admitting: Internal Medicine

## 2024-01-02 ENCOUNTER — Telehealth: Payer: Self-pay | Admitting: Internal Medicine

## 2024-01-02 NOTE — Telephone Encounter (Signed)
 07/08/2023 same day cancel 12/17/2023 no show  Final warning sent via mail and mychart

## 2024-01-02 NOTE — Telephone Encounter (Signed)
 Provider aware

## 2024-01-07 ENCOUNTER — Encounter: Admitting: Internal Medicine

## 2024-01-14 ENCOUNTER — Ambulatory Visit (INDEPENDENT_AMBULATORY_CARE_PROVIDER_SITE_OTHER): Admitting: Internal Medicine

## 2024-01-14 ENCOUNTER — Encounter: Payer: Self-pay | Admitting: Internal Medicine

## 2024-01-14 VITALS — BP 120/84 | HR 89 | Temp 97.6°F | Ht 67.0 in | Wt 239.8 lb

## 2024-01-14 DIAGNOSIS — Z1231 Encounter for screening mammogram for malignant neoplasm of breast: Secondary | ICD-10-CM

## 2024-01-14 DIAGNOSIS — Z0000 Encounter for general adult medical examination without abnormal findings: Secondary | ICD-10-CM

## 2024-01-14 DIAGNOSIS — E669 Obesity, unspecified: Secondary | ICD-10-CM

## 2024-01-14 DIAGNOSIS — R5383 Other fatigue: Secondary | ICD-10-CM

## 2024-01-14 DIAGNOSIS — Z1211 Encounter for screening for malignant neoplasm of colon: Secondary | ICD-10-CM | POA: Diagnosis not present

## 2024-01-14 DIAGNOSIS — F321 Major depressive disorder, single episode, moderate: Secondary | ICD-10-CM

## 2024-01-14 DIAGNOSIS — M25551 Pain in right hip: Secondary | ICD-10-CM

## 2024-01-14 MED ORDER — BUPROPION HCL ER (XL) 300 MG PO TB24: 300.0000 mg | ORAL_TABLET | Freq: Every morning | ORAL | 3 refills | Status: AC

## 2024-01-14 MED ORDER — ESCITALOPRAM OXALATE 10 MG PO TABS: 10.0000 mg | ORAL_TABLET | Freq: Every day | ORAL | 3 refills | Status: AC

## 2024-01-14 MED ORDER — MELOXICAM 7.5 MG PO TABS: 7.5000 mg | ORAL_TABLET | Freq: Two times a day (BID) | ORAL | 0 refills | Status: AC
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
## 2024-01-14 NOTE — Patient Instructions (Signed)
Return for fasting lab work. 

## 2024-01-15 ENCOUNTER — Other Ambulatory Visit (INDEPENDENT_AMBULATORY_CARE_PROVIDER_SITE_OTHER)

## 2024-01-15 DIAGNOSIS — Z0000 Encounter for general adult medical examination without abnormal findings: Secondary | ICD-10-CM | POA: Diagnosis not present

## 2024-01-15 LAB — CBC WITH DIFFERENTIAL/PLATELET
Basophils Absolute: 0 10*3/uL (ref 0.0–0.1)
Basophils Relative: 0.8 % (ref 0.0–3.0)
Eosinophils Absolute: 0.1 10*3/uL (ref 0.0–0.7)
Eosinophils Relative: 2 % (ref 0.0–5.0)
HCT: 42.6 % (ref 36.0–46.0)
Hemoglobin: 14.3 g/dL (ref 12.0–15.0)
Lymphocytes Relative: 44.5 % (ref 12.0–46.0)
Lymphs Abs: 1.9 10*3/uL (ref 0.7–4.0)
MCHC: 33.5 g/dL (ref 30.0–36.0)
MCV: 89.5 fl (ref 78.0–100.0)
Monocytes Absolute: 0.4 10*3/uL (ref 0.1–1.0)
Monocytes Relative: 9 % (ref 3.0–12.0)
Neutro Abs: 1.9 10*3/uL (ref 1.4–7.7)
Neutrophils Relative %: 43.7 % (ref 43.0–77.0)
Platelets: 182 10*3/uL (ref 150.0–400.0)
RBC: 4.77 Mil/uL (ref 3.87–5.11)
RDW: 12.5 % (ref 11.5–15.5)
WBC: 4.3 10*3/uL (ref 4.0–10.5)

## 2024-01-15 LAB — COMPREHENSIVE METABOLIC PANEL WITH GFR
ALT: 13 U/L (ref 0–35)
AST: 17 U/L (ref 0–37)
Albumin: 4.1 g/dL (ref 3.5–5.2)
Alkaline Phosphatase: 48 U/L (ref 39–117)
BUN: 13 mg/dL (ref 6–23)
CO2: 29 meq/L (ref 19–32)
Calcium: 9 mg/dL (ref 8.4–10.5)
Chloride: 103 meq/L (ref 96–112)
Creatinine, Ser: 0.86 mg/dL (ref 0.40–1.20)
GFR: 79.88 mL/min (ref >60.00)
Glucose, Bld: 96 mg/dL (ref 70–99)
Potassium: 3.9 meq/L (ref 3.5–5.1)
Sodium: 138 meq/L (ref 135–145)
Total Bilirubin: 0.6 mg/dL (ref 0.2–1.2)
Total Protein: 7 g/dL (ref 6.0–8.3)

## 2024-01-15 LAB — LIPID PANEL
Cholesterol: 206 mg/dL — ABNORMAL HIGH (ref 0–200)
HDL: 63.9 mg/dL (ref >39.00)
LDL Cholesterol: 121 mg/dL — ABNORMAL HIGH (ref 0–99)
NonHDL: 142.11
Total CHOL/HDL Ratio: 3
Triglycerides: 104 mg/dL (ref 0.0–149.0)
VLDL: 20.8 mg/dL (ref 0.0–40.0)

## 2024-01-15 LAB — TSH: TSH: 2.72 u[IU]/mL (ref 0.35–5.50)

## 2024-01-17 ENCOUNTER — Ambulatory Visit

## 2024-01-17 ENCOUNTER — Ambulatory Visit: Payer: Self-pay | Admitting: Internal Medicine

## 2024-01-17 DIAGNOSIS — R5383 Other fatigue: Secondary | ICD-10-CM

## 2024-01-17 LAB — VITAMIN D 25 HYDROXY (VIT D DEFICIENCY, FRACTURES): VITD: 22.53 ng/mL — ABNORMAL LOW (ref 30.00–100.00)

## 2024-01-21 ENCOUNTER — Other Ambulatory Visit (INDEPENDENT_AMBULATORY_CARE_PROVIDER_SITE_OTHER): Payer: Self-pay

## 2024-01-21 ENCOUNTER — Encounter: Payer: Self-pay | Admitting: Physician Assistant

## 2024-01-21 ENCOUNTER — Ambulatory Visit: Admitting: Physician Assistant

## 2024-01-21 DIAGNOSIS — M25551 Pain in right hip: Secondary | ICD-10-CM | POA: Diagnosis not present

## 2024-01-21 DIAGNOSIS — S73191A Other sprain of right hip, initial encounter: Secondary | ICD-10-CM | POA: Diagnosis not present
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
## 2024-02-03 ENCOUNTER — Encounter

## 2024-02-26 ENCOUNTER — Ambulatory Visit
Admission: RE | Admit: 2024-02-26 | Discharge: 2024-02-26 | Disposition: A | Discharge status: Home / Self Care | Source: Ambulatory Visit | Attending: Physician Assistant | Admitting: Physician Assistant

## 2024-02-26 DIAGNOSIS — M25551 Pain in right hip: Secondary | ICD-10-CM

## 2024-02-26 DIAGNOSIS — S73191A Other sprain of right hip, initial encounter: Secondary | ICD-10-CM

## 2024-02-26 DIAGNOSIS — M7601 Gluteal tendinitis, right hip: Secondary | ICD-10-CM | POA: Diagnosis not present

## 2024-02-26 MED ORDER — IOPAMIDOL (ISOVUE-M 200) INJECTION 41%
10.0000 mL | Freq: Once | INTRAMUSCULAR | Status: AC
  Administered 2024-02-26: 10 mL via INTRA_ARTICULAR

## 2024-03-04 ENCOUNTER — Other Ambulatory Visit: Payer: Self-pay

## 2024-03-04 ENCOUNTER — Emergency Department (HOSPITAL_BASED_OUTPATIENT_CLINIC_OR_DEPARTMENT_OTHER): Admission: EM | Admit: 2024-03-04 | Discharge: 2024-03-04 | Disposition: A | Discharge status: Home / Self Care

## 2024-03-04 ENCOUNTER — Encounter (HOSPITAL_BASED_OUTPATIENT_CLINIC_OR_DEPARTMENT_OTHER): Payer: Self-pay | Admitting: Emergency Medicine

## 2024-03-04 ENCOUNTER — Other Ambulatory Visit (HOSPITAL_BASED_OUTPATIENT_CLINIC_OR_DEPARTMENT_OTHER): Payer: Self-pay

## 2024-03-04 ENCOUNTER — Emergency Department (HOSPITAL_BASED_OUTPATIENT_CLINIC_OR_DEPARTMENT_OTHER)

## 2024-03-04 ENCOUNTER — Encounter

## 2024-03-04 DIAGNOSIS — R109 Unspecified abdominal pain: Secondary | ICD-10-CM

## 2024-03-04 DIAGNOSIS — R1031 Right lower quadrant pain: Secondary | ICD-10-CM | POA: Diagnosis not present

## 2024-03-04 DIAGNOSIS — M51369 Other intervertebral disc degeneration, lumbar region without mention of lumbar back pain or lower extremity pain: Secondary | ICD-10-CM | POA: Diagnosis not present

## 2024-03-04 DIAGNOSIS — M25551 Pain in right hip: Secondary | ICD-10-CM | POA: Diagnosis not present

## 2024-03-04 LAB — URINALYSIS, ROUTINE W REFLEX MICROSCOPIC
Bilirubin Urine: NEGATIVE
Glucose, UA: NEGATIVE mg/dL
Hgb urine dipstick: NEGATIVE
Ketones, ur: NEGATIVE mg/dL
Leukocytes,Ua: NEGATIVE
Nitrite: NEGATIVE
Protein, ur: NEGATIVE mg/dL
Specific Gravity, Urine: 1.01 (ref 1.005–1.030)
pH: 6.5 (ref 5.0–8.0)

## 2024-03-04 LAB — COMPREHENSIVE METABOLIC PANEL WITH GFR
ALT: 15 U/L (ref 0–44)
AST: 22 U/L (ref 15–41)
Albumin: 4.3 g/dL (ref 3.5–5.0)
Alkaline Phosphatase: 67 U/L (ref 38–126)
Anion gap: 11 (ref 5–15)
BUN: 12 mg/dL (ref 6–20)
CO2: 25 mmol/L (ref 22–32)
Calcium: 9.3 mg/dL (ref 8.9–10.3)
Chloride: 102 mmol/L (ref 98–111)
Creatinine, Ser: 0.96 mg/dL (ref 0.44–1.00)
GFR, Estimated: >60 mL/min (ref >60)
Glucose, Bld: 84 mg/dL (ref 70–99)
Potassium: 4.6 mmol/L (ref 3.5–5.1)
Sodium: 137 mmol/L (ref 135–145)
Total Bilirubin: 0.5 mg/dL (ref 0.0–1.2)
Total Protein: 7.3 g/dL (ref 6.5–8.1)

## 2024-03-04 LAB — CBC
HCT: 42 % (ref 36.0–46.0)
Hemoglobin: 14.4 g/dL (ref 12.0–15.0)
MCH: 30.3 pg (ref 26.0–34.0)
MCHC: 34.3 g/dL (ref 30.0–36.0)
MCV: 88.4 fL (ref 80.0–100.0)
Platelets: 192 K/uL (ref 150–400)
RBC: 4.75 MIL/uL (ref 3.87–5.11)
RDW: 11.9 % (ref 11.5–15.5)
WBC: 4.8 K/uL (ref 4.0–10.5)
nRBC: 0 % (ref 0.0–0.2)

## 2024-03-04 LAB — HCG, QUANTITATIVE, PREGNANCY: hCG, Beta Chain, Quant, S: 1 m[IU]/mL (ref <5)

## 2024-03-04 MED ORDER — CYCLOBENZAPRINE HCL 10 MG PO TABS
10.0000 mg | ORAL_TABLET | Freq: Two times a day (BID) | ORAL | 0 refills | Status: AC | PRN
  Filled 2024-03-04: qty 20, 10d supply, fill #0

## 2024-03-04 MED ORDER — PREDNISONE 10 MG PO TABS
ORAL_TABLET | Freq: Every day | ORAL | 0 refills | Status: AC
  Filled 2024-03-04: qty 42, 12d supply, fill #0

## 2024-03-04 MED ORDER — ONDANSETRON HCL 4 MG/2ML IJ SOLN
4.0000 mg | Freq: Once | INTRAMUSCULAR | Status: AC
  Administered 2024-03-04: 4 mg via INTRAVENOUS
  Filled 2024-03-04: qty 2

## 2024-03-04 MED ORDER — MORPHINE SULFATE (PF) 4 MG/ML IV SOLN
4.0000 mg | Freq: Once | INTRAVENOUS | Status: AC
  Administered 2024-03-04: 4 mg via INTRAVENOUS
  Filled 2024-03-04: qty 1

## 2024-03-04 MED ORDER — IOHEXOL 300 MG/ML  SOLN
100.0000 mL | Freq: Once | INTRAMUSCULAR | Status: AC | PRN
  Administered 2024-03-04: 100 mL via INTRAVENOUS

## 2024-03-04 MED ORDER — KETOROLAC TROMETHAMINE 15 MG/ML IJ SOLN
15.0000 mg | Freq: Once | INTRAMUSCULAR | Status: AC
  Administered 2024-03-04: 15 mg via INTRAVENOUS
  Filled 2024-03-04: qty 1

## 2024-03-04 MED ORDER — SODIUM CHLORIDE 0.9 % IV BOLUS
1000.0000 mL | Freq: Once | INTRAVENOUS | Status: AC
  Administered 2024-03-04: 1000 mL via INTRAVENOUS
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
## 2024-03-04 NOTE — ED Notes (Signed)

## 2024-03-04 NOTE — Discharge Instructions (Addendum)
 As discussed, your workup today was reassuring.  Labs appeared normal.  There is nothing acutely abnormal inside the abdomen, pelvis or low back CT scan.  Your symptoms could be secondary to nerve impingement coming from the back or referred pain from the right hip.  Will try a prednisone  taper as well as muscle relaxer for treatment of your pain at home.  Recommend follow-up with primary care/orthopedics in the outpatient setting for reassessment.  Please do not hesitate to return to emergency department if the worrisome signs and symptoms we discussed become apparent.
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
## 2024-03-06 ENCOUNTER — Telehealth: Payer: Self-pay

## 2024-03-06 NOTE — Transitions of Care (Post Inpatient/ED Visit) (Signed)
   03/06/2024  Name: Rebecca Briggs MRN: [id number] DOB: Mar 29, 1976  Today's TOC FU Call Status: Today's TOC FU Call Status:: Unsuccessful Call (1st Attempt) Unsuccessful Call (1st Attempt) Date: 03/06/24  Attempted to reach the patient regarding the most recent Inpatient/ED visit.  Follow Up Plan: Additional outreach attempts will be made to reach the patient to complete the Transitions of Care (Post Inpatient/ED visit) call.   Signature  Makayleigh Poliquin D, CMA

## 2024-03-09 ENCOUNTER — Encounter

## 2024-03-18 ENCOUNTER — Other Ambulatory Visit (HOSPITAL_BASED_OUTPATIENT_CLINIC_OR_DEPARTMENT_OTHER): Payer: Self-pay

## 2024-03-18 ENCOUNTER — Ambulatory Visit (HOSPITAL_BASED_OUTPATIENT_CLINIC_OR_DEPARTMENT_OTHER): Payer: Self-pay | Admitting: Orthopaedic Surgery

## 2024-03-18 ENCOUNTER — Encounter (HOSPITAL_BASED_OUTPATIENT_CLINIC_OR_DEPARTMENT_OTHER): Payer: Self-pay

## 2024-03-18 ENCOUNTER — Ambulatory Visit (HOSPITAL_BASED_OUTPATIENT_CLINIC_OR_DEPARTMENT_OTHER): Admitting: Orthopaedic Surgery

## 2024-03-18 DIAGNOSIS — S73191A Other sprain of right hip, initial encounter: Secondary | ICD-10-CM

## 2024-03-18 MED ORDER — ASPIRIN 325 MG PO TBEC
325.0000 mg | DELAYED_RELEASE_TABLET | Freq: Every day | ORAL | 0 refills | Status: AC
Start: 2024-03-18 — End: ?
  Filled 2024-03-18: qty 14, 14d supply, fill #0

## 2024-03-18 MED ORDER — OXYCODONE HCL 5 MG PO TABS
5.0000 mg | ORAL_TABLET | ORAL | 0 refills | Status: AC | PRN
  Filled 2024-03-18: qty 10, 2d supply, fill #0

## 2024-03-18 MED ORDER — ACETAMINOPHEN 500 MG PO TABS
500.0000 mg | ORAL_TABLET | Freq: Four times a day (QID) | ORAL | 0 refills | Status: AC | PRN
Start: 2024-03-18 — End: ?
  Filled 2024-03-18: qty 30, 8d supply, fill #0
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
## 2024-03-20 ENCOUNTER — Encounter

## 2024-05-14 ENCOUNTER — Encounter (HOSPITAL_BASED_OUTPATIENT_CLINIC_OR_DEPARTMENT_OTHER): Payer: Self-pay | Admitting: Orthopaedic Surgery

## 2024-05-14 DIAGNOSIS — S73191A Other sprain of right hip, initial encounter: Secondary | ICD-10-CM | POA: Diagnosis not present

## 2024-05-14 DIAGNOSIS — F32A Depression, unspecified: Secondary | ICD-10-CM | POA: Diagnosis not present

## 2024-05-14 NOTE — Progress Notes (Signed)
 Date of Surgery: 05/14/2024  INDICATIONS: Rebecca Briggs is a 48 y.o.-year-old female with right hip instability.  The risk and benefits of the procedure were discussed in detail and documented in the pre-operative evaluation.   PREOPERATIVE DIAGNOSIS: 1.  Right hip labral tear  POSTOPERATIVE DIAGNOSIS: Same.  PROCEDURE: 1.  Right hip labral repair  SURGEON: Elspeth LITTIE Parker MD  ASSISTANT: Conley Dawson, ATC  ANESTHESIA:  general  IV FLUIDS AND URINE: See anesthesia record.  ANTIBIOTICS: Ancef  ESTIMATED BLOOD LOSS: 10 mL.  IMPLANTS:  * No surgical log found *  DRAINS: None  CULTURES: None  COMPLICATIONS: none  DESCRIPTION OF PROCEDURE:  Cartilage Intact femoral and acetabular cartilage   Labrum Mildly hypoplastic/torn appearing   Boundaries of labral tear Convention (3 o'clock anterior, 9 o'clock posterior) Anterior boundary: 3 o'clock Posterior boundary: 1 o'clock   OPERATIVE REPORT:  The patient was brought to the operating room, placed supine on the operating table, and bony prominences were padded.  The traction boots were applied with padding to ensure that safe traction could be applied through the feet.  The contralateral limb was abducted maximally and light traction was applied.  The operative leg was brought into neutral position.  The flouroscopic c-arm was brought between the legs for an AP image.  The patient was prepped and draped in a sterile fashion.  Time-out was performed and landmarks were identified. Traction was obtained and care was taken to ensure the least amount of force necessary to allow safe access to the joint of 8-51mm.  This was checked with fluoroscopy.    Next we placed an anterolateral portal under the assistance of fluoroscopy.  First, fluoroscopy was used to estimate the trajectory and starting point.  A 5mm incision with a #11 blade was made and a straight hemostat was used to dilate the portal through the appropriate tract.  We then  placed a 14-gauge hypodermic needle with careful technique to be as close to the femoral head as possible and parallel to the sorcele to ensure no iatrogenic damage to the labrum.  This released the negative pressure environment and the amount of traction was adjusted to maintain the 8-72mm of distraction.  A nitinol wire was placed through the needle and flouroscopy was used to ensure it extended to the medial wall of the acetabulum.  The Flowport from [employer] was placed over the wire and the nitinol wire was retracted to just inside the capsule during insertion of the dilator and cannula to minimize the risk of breakage. The arthroscope was placed next and we visualized the anterior triangle.     We then placed the anterior portal under direct visualization using the technique described above.  This was safely placed as well without damage to the labrum or femoral head.  We then switched our arthroscope to the anterior portal to ensure we were not through the labrum - we were safely through the capsule only.  We then proceeded with periportal capsulotomies utilizing the Samurai blade in each portal without connecting the two.  We identified the anterior inferior iliac spine proximally, the psoas tendon medially and the rectus tendon laterally as landmarks.  We then proceeded with a diagnostic arthroscopy - the results can be found in the findings section above.    We then used the radiofrequency device to clear the superior acetabulum and expose the subspinous region.  Next we exposed the acetabular rim leaving the chondral labral junction intact.   When adequate  reshaping was obtained we then proceeded with the labral repair. We placed 2 anchors at the 1:00 and 2:30 positions. The sutures were passed using the crescent Nanopass from [employer].  This resulted in anatomic labral repair.  We debrided the loose cartilage at the rim and residual degenerative labral tissue.  Traction was let down  with total traction time of 22 minutes.     Finally, we performed a complete capsular closure with tape suture.  She was replaced in the anterior and posterior limb of the reported capsulotomy with excellent apposition. We then removed the arthroscope and closed the incisions with 3-0 nylon simple stitches.  A sterile dressing was applied..  The patient was awakened from anesthesia and transferred to PACU in stable condition. Postoperative care includes:       POSTOPERATIVE PLAN:    Weight bearing as tolerated operative extremity Formal physical therapy will begin immediately within the first weeks of surgery ASA 325 Daily for DVT prophylaxis    Elspeth LITTIE Parker, MD 1:28 PM
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
## 2024-05-19 ENCOUNTER — Ambulatory Visit: Attending: Orthopaedic Surgery | Admitting: Rehabilitation

## 2024-05-19 ENCOUNTER — Other Ambulatory Visit: Payer: Self-pay

## 2024-05-19 ENCOUNTER — Encounter: Payer: Self-pay | Admitting: Rehabilitation

## 2024-05-19 DIAGNOSIS — M25551 Pain in right hip: Secondary | ICD-10-CM | POA: Diagnosis not present

## 2024-05-19 DIAGNOSIS — M25552 Pain in left hip: Secondary | ICD-10-CM | POA: Diagnosis not present

## 2024-05-19 DIAGNOSIS — R2689 Other abnormalities of gait and mobility: Secondary | ICD-10-CM | POA: Diagnosis not present

## 2024-05-19 DIAGNOSIS — M6281 Muscle weakness (generalized): Secondary | ICD-10-CM | POA: Diagnosis not present

## 2024-05-19 DIAGNOSIS — S73191A Other sprain of right hip, initial encounter: Secondary | ICD-10-CM | POA: Diagnosis not present

## 2024-05-28 ENCOUNTER — Ambulatory Visit: Attending: Orthopaedic Surgery

## 2024-05-28 ENCOUNTER — Ambulatory Visit (INDEPENDENT_AMBULATORY_CARE_PROVIDER_SITE_OTHER): Admitting: Orthopaedic Surgery

## 2024-05-28 DIAGNOSIS — S73191A Other sprain of right hip, initial encounter: Secondary | ICD-10-CM

## 2024-05-28 NOTE — Progress Notes (Signed)
 Chief Complaint: Right hip labral repair 9/25     History of Present Illness:    Rebecca Briggs is a 48 y.o. female presents 2 weeks status post right hip labral repair.  Overall she is doing extremely well.  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   Peroneal tendinitis of right lower extremity 12/26/2021   Synovitis of right ankle 01/16/2022   TMJ (dislocation of temporomandibular joint)    Vertigo    Past Surgical History:  Procedure Laterality Date   ANKLE ARTHROSCOPY Right    APPENDECTOMY     BILATERAL SALPINGECTOMY     PARTIAL HYSTERECTOMY     TUBAL LIGATION     Social History   Socioeconomic History   Marital status: Married    Spouse name: Not on file   Number of children: 3   Years of education: Not on file   Highest education level: Some college, no degree  Occupational History   Occupation: Counselling psychologist: [employer]  Tobacco Use   Smoking status: Never   Smokeless tobacco: Never  Substance and Sexual Activity   Alcohol use: Yes    Comment: occ   Drug use: No   Sexual activity: Not on file  Other Topics Concern   Not on file  Social History Narrative   Divorced, 2 sons born in 87 in 82 and 1 daughter born 54.  Her children are in [place] .  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   1 caffeinated beverage daily no drugs tobacco.   Occasional alcohol   Social Drivers of Corporate investment banker Strain: Low Risk  (01/13/2024)   Overall Financial Resource Strain (CARDIA)    Difficulty of Paying Living Expenses: Not hard at all  Food Insecurity: No Food Insecurity (01/13/2024)   Hunger Vital Sign    Worried About Running Out of Food in the Last Year: Never true    Ran Out of Food in the Last Year:  Never true  Transportation Needs: No Transportation Needs (01/13/2024)   PRAPARE - Administrator, Civil Service (Medical): No    Lack of Transportation (Non-Medical): No  Physical Activity: Sufficiently Active (01/13/2024)   Exercise Vital Sign    Days of Exercise per Week: 4 days    Minutes of Exercise per Session: 40 min  Stress: Stress Concern Present (01/13/2024)   [employer] of Occupational Health - Occupational Stress Questionnaire    Feeling of Stress : Very much  Social Connections: Moderately Integrated (01/13/2024)   Social Connection and Isolation Panel    Frequency of Communication with Friends and Family: More than three times a week    Frequency of Social Gatherings with Friends and Family: Patient declined    Attends Religious Services: 1 to 4 times per year    Active Member of [employer] or Organizations: No    Attends Engineer, structural: Not on file    Marital Status: Married   No family history on file. No Known Allergies Current Outpatient Medications  Medication Sig Dispense Refill   acetaminophen  (TYLENOL ) 500 MG tablet Take 1 tablet (500 mg total) by mouth every 6 (six) hours as needed. 30 tablet 0   albuterol  (VENTOLIN  HFA) 108 (90 Base) MCG/ACT  inhaler Inhale into the lungs.     aspirin  EC 325 MG tablet Take 1 tablet (325 mg total) by mouth daily. 14 tablet 0   buPROPion  (WELLBUTRIN  XL) 300 MG 24 hr tablet Take 1 tablet (300 mg total) by mouth every morning. 90 tablet 3   cyclobenzaprine  (FLEXERIL ) 10 MG tablet Take 1 tablet (10 mg total) by mouth 2 (two) times daily as needed for muscle spasms. 20 tablet 0   dimenhyDRINATE (DRAMAMINE) 50 MG tablet Take by mouth.     escitalopram  (LEXAPRO ) 10 MG tablet Take 1 tablet (10 mg total) by mouth daily. 90 tablet 3   lidocaine  4 % Place 1 patch onto the skin daily. 15 patch 0   oxyCODONE  (OXY IR/ROXICODONE ) 5 MG immediate release tablet Take 1 tablet (5 mg total) by mouth every 4 (four) hours as  needed. 10 tablet 0   predniSONE  (DELTASONE ) 10 MG tablet Take by mouth daily. Take 6 tabs by mouth daily  for 2 days, then 5 tabs for 2 days, then 4 tabs for 2 days, then 3 tabs for 2 days, 2 tabs for 2 days, then 1 tab by mouth daily for 2 days 42 tablet 0   No current facility-administered medications for this visit.   No results found.  Review of Systems:   A ROS was performed including pertinent positives and negatives as documented in the HPI.  Physical Exam :   Constitutional: NAD and appears stated age Neurological: Alert and oriented Psych: Appropriate affect and cooperative There were no vitals taken for this visit.   Comprehensive Musculoskeletal Exam:    Right hip incisions are well-appearing without erythema or drainage.  30 degrees internal/external rotation are without pain, negative FADIR good abduction strength is neurosensory exams intact   Imaging:      I personally reviewed and interpreted the radiographs.   Assessment and Plan:   48 y.o. female status post right hip arthroscopic labral repair overall doing extremely well.  I will plan to see her back in 4 weeks for reassessment  -Return to clinic 4 weeks for reassessment   I personally saw and evaluated the patient, and participated in the management and treatment plan.  Elspeth Parker, MD Attending Physician, Orthopedic Surgery  This document was dictated using Dragon voice recognition software. A reasonable attempt at proof reading has been made to minimize errors.

## 2024-06-04 ENCOUNTER — Ambulatory Visit

## 2024-06-11 ENCOUNTER — Ambulatory Visit: Admitting: Rehabilitation

## 2024-06-11 ENCOUNTER — Telehealth: Payer: Self-pay | Admitting: Rehabilitation

## 2024-06-11 NOTE — Telephone Encounter (Signed)
 Patient was no show for today's appointment.  Called and L/M requesting call back from patient to clarify if she is going to continue with PT or not.   This is her 3rd no show, but she is an ortho surgery patient.   She has an appointment scheduled next week, but if we do not hear back from her we are going to cancel that appointment and discharge from PT

## 2024-06-18 ENCOUNTER — Ambulatory Visit: Admitting: Rehabilitation

## 2024-06-22 ENCOUNTER — Encounter: Payer: Self-pay | Admitting: Radiology

## 2024-07-02 ENCOUNTER — Encounter (HOSPITAL_BASED_OUTPATIENT_CLINIC_OR_DEPARTMENT_OTHER): Admitting: Orthopaedic Surgery

## 2024-07-30 ENCOUNTER — Ambulatory Visit (INDEPENDENT_AMBULATORY_CARE_PROVIDER_SITE_OTHER): Admitting: Orthopaedic Surgery

## 2024-07-30 DIAGNOSIS — S73191A Other sprain of right hip, initial encounter: Secondary | ICD-10-CM

## 2024-07-30 NOTE — Progress Notes (Signed)
 Chief Complaint: Right hip labral repair 9/25     History of Present Illness:    Rebecca Briggs is a 48 y.o. female presents status post the above procedure.  Overall she doing extremely well.  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   Peroneal tendinitis of right lower extremity 12/26/2021   Synovitis of right ankle 01/16/2022   TMJ (dislocation of temporomandibular joint)    Vertigo    Past Surgical History:  Procedure Laterality Date   ANKLE ARTHROSCOPY Right    APPENDECTOMY     BILATERAL SALPINGECTOMY     PARTIAL HYSTERECTOMY     TUBAL LIGATION     Social History   Socioeconomic History   Marital status: Married    Spouse name: Not on file   Number of children: 3   Years of education: Not on file   Highest education level: Some college, no degree  Occupational History   Occupation: Counselling Psychologist: [employer]  Tobacco Use   Smoking status: Never   Smokeless tobacco: Never  Substance and Sexual Activity   Alcohol use: Yes    Comment: occ   Drug use: No   Sexual activity: Not on file  Other Topics Concern   Not on file  Social History Narrative   Divorced, 2 sons born in 82 in [birth year] and 1 daughter born 68.  Her children are in [place] .  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   1 caffeinated beverage daily no drugs tobacco.   Occasional alcohol   Social Drivers of Health   Tobacco Use: Low Risk (05/19/2024)   Patient History    Smoking Tobacco Use: Never    Smokeless Tobacco Use: Never    Passive Exposure: Not on file  Financial Resource Strain: Low Risk (01/13/2024)   Overall Financial Resource Strain (CARDIA)    Difficulty of Paying Living Expenses: Not hard at all  Food Insecurity: No Food Insecurity (01/13/2024)   Hunger  Vital Sign    Worried About Running Out of Food in the Last Year: Never true    Ran Out of Food in the Last Year: Never true  Transportation Needs: No Transportation Needs (01/13/2024)   PRAPARE - Administrator, Civil Service (Medical): No    Lack of Transportation (Non-Medical): No  Physical Activity: Sufficiently Active (01/13/2024)   Exercise Vital Sign    Days of Exercise per Week: 4 days    Minutes of Exercise per Session: 40 min  Stress: Stress Concern Present (01/13/2024)   [employer] of Occupational Health - Occupational Stress Questionnaire    Feeling of Stress : Very much  Social Connections: Moderately Integrated (01/13/2024)   Social Connection and Isolation Panel    Frequency of Communication with Friends and Family: More than three times a week    Frequency of Social Gatherings with Friends and Family: Patient declined    Attends Religious Services: 1 to 4 times per year    Active Member of Clubs or Organizations: No    Attends Banker Meetings: Not on file    Marital Status: Married  Depression (PHQ2-9): High Risk (01/14/2024)   Depression (PHQ2-9)    PHQ-2 Score: 18  Alcohol Screen: Low Risk (01/13/2024)   Alcohol  Screen    Last Alcohol Screening Score (AUDIT): 1  Housing: Low Risk (01/13/2024)   Housing Stability Vital Sign    Unable to Pay for Housing in the Last Year: No    Number of Times Moved in the Last Year: 0    Homeless in the Last Year: No  Utilities: Not on file  Health Literacy: Not on file   No family history on file. No Known Allergies Current Outpatient Medications  Medication Sig Dispense Refill   acetaminophen  (TYLENOL ) 500 MG tablet Take 1 tablet (500 mg total) by mouth every 6 (six) hours as needed. 30 tablet 0   albuterol  (VENTOLIN  HFA) 108 (90 Base) MCG/ACT inhaler Inhale into the lungs.     aspirin  EC 325 MG tablet Take 1 tablet (325 mg total) by mouth daily. 14 tablet 0   buPROPion  (WELLBUTRIN  XL) 300 MG 24 hr  tablet Take 1 tablet (300 mg total) by mouth every morning. 90 tablet 3   cyclobenzaprine  (FLEXERIL ) 10 MG tablet Take 1 tablet (10 mg total) by mouth 2 (two) times daily as needed for muscle spasms. 20 tablet 0   dimenhyDRINATE (DRAMAMINE) 50 MG tablet Take by mouth.     escitalopram  (LEXAPRO ) 10 MG tablet Take 1 tablet (10 mg total) by mouth daily. 90 tablet 3   lidocaine  4 % Place 1 patch onto the skin daily. 15 patch 0   oxyCODONE  (OXY IR/ROXICODONE ) 5 MG immediate release tablet Take 1 tablet (5 mg total) by mouth every 4 (four) hours as needed. 10 tablet 0   predniSONE  (DELTASONE ) 10 MG tablet Take by mouth daily. Take 6 tabs by mouth daily  for 2 days, then 5 tabs for 2 days, then 4 tabs for 2 days, then 3 tabs for 2 days, 2 tabs for 2 days, then 1 tab by mouth daily for 2 days 42 tablet 0   No current facility-administered medications for this visit.   No results found.  Review of Systems:   A ROS was performed including pertinent positives and negatives as documented in the HPI.  Physical Exam :   Constitutional: NAD and appears stated age Neurological: Alert and oriented Psych: Appropriate affect and cooperative There were no vitals taken for this visit.   Comprehensive Musculoskeletal Exam:    Right hip incisions are well-appearing without erythema or drainage.  30 degrees internal/external rotation are without pain, negative FADIR good abduction strength is neurosensory exams intact   Imaging:      I personally reviewed and interpreted the radiographs.   Assessment and Plan:   47 y.o. female status post right hip arthroscopic labral repair overall doing extremely well.  I do see her back as needed  -Return to clinic as needed   I personally saw and evaluated the patient, and participated in the management and treatment plan.  Elspeth Parker, MD Attending Physician, Orthopedic Surgery  This document was dictated using Dragon voice recognition software. A  reasonable attempt at proof reading has been made to minimize errors.

## 2025-01-15 ENCOUNTER — Encounter: Admitting: Internal Medicine
